# Patient Record
Sex: Female | Born: 1965 | Race: White | Hispanic: No | Marital: Married
Health system: Midwestern US, Academic
[De-identification: ages and names within clinical notes are randomized; demographics above are authoritative.]

---

## 2016-08-20 ENCOUNTER — Encounter: Admit: 2016-08-20 | Discharge: 2016-08-20

## 2016-08-20 ENCOUNTER — Ambulatory Visit: Admit: 2016-08-20 | Discharge: 2016-08-20 | Payer: Private Health Insurance - Indemnity

## 2016-08-20 ENCOUNTER — Ambulatory Visit: Admit: 2016-08-20 | Discharge: 2016-08-20

## 2016-08-20 DIAGNOSIS — N3000 Acute cystitis without hematuria: ICD-10-CM

## 2016-08-20 DIAGNOSIS — G35 Multiple sclerosis: ICD-10-CM

## 2016-08-20 DIAGNOSIS — G629 Polyneuropathy, unspecified: ICD-10-CM

## 2016-08-20 DIAGNOSIS — R51 Headache: ICD-10-CM

## 2016-08-20 DIAGNOSIS — Z79899 Other long term (current) drug therapy: ICD-10-CM

## 2016-08-20 DIAGNOSIS — I050 Rheumatic mitral stenosis: ICD-10-CM

## 2016-08-20 DIAGNOSIS — H547 Unspecified visual loss: ICD-10-CM

## 2016-08-20 DIAGNOSIS — N390 Urinary tract infection, site not specified: ICD-10-CM

## 2016-08-20 DIAGNOSIS — A64 Unspecified sexually transmitted disease: ICD-10-CM

## 2016-08-20 DIAGNOSIS — M1990 Unspecified osteoarthritis, unspecified site: Principal | ICD-10-CM

## 2016-08-20 MED ORDER — NITROFURANTOIN MACROCRYSTAL 50 MG PO CAP
50 mg | ORAL_CAPSULE | Freq: Two times a day (BID) | ORAL | 0 refills | 7.00000 days | Status: AC
Start: 2016-08-20 — End: ?

## 2016-08-20 MED ORDER — GADOBENATE DIMEGLUMINE 529 MG/ML (0.1MMOL/0.2ML) IV SOLN
11 mL | Freq: Once | INTRAVENOUS | 0 refills | Status: CP
Start: 2016-08-20 — End: ?
  Administered 2016-08-20: 18:00:00 11 mL via INTRAVENOUS

## 2016-08-21 NOTE — Assessment & Plan Note
Causing Pseudo-exacerbation  Macrodantin 50 mg QID

## 2016-08-22 LAB — COMPREHENSIVE METABOLIC PANEL
Lab: 0.4
Lab: 0.7
Lab: 103
Lab: 141 K/UL (ref 1.8–7.0)
Lab: 18
Lab: 29
Lab: 3.6
Lab: 3.8
Lab: 7.3
Lab: 9.2
Lab: 94

## 2016-08-22 LAB — CBC AND DIFF
Lab: 0.1
Lab: 0.1
Lab: 0.5
Lab: 0.9
Lab: 1.4 — ABNORMAL LOW
Lab: 12
Lab: 13
Lab: 2.4
Lab: 23 — ABNORMAL LOW
Lab: 255
Lab: 3.7
Lab: 30
Lab: 33
Lab: 39
Lab: 4.4
Lab: 5.8 — ABNORMAL HIGH
Lab: 63
Lab: 89
Lab: 9.2
Lab: 9.3

## 2016-08-27 ENCOUNTER — Encounter: Admit: 2016-08-27 | Discharge: 2016-08-27

## 2016-08-27 DIAGNOSIS — G35 Multiple sclerosis: ICD-10-CM

## 2016-08-27 DIAGNOSIS — R350 Frequency of micturition: ICD-10-CM

## 2016-08-27 DIAGNOSIS — R531 Weakness: Principal | ICD-10-CM

## 2016-08-28 ENCOUNTER — Encounter: Admit: 2016-08-28 | Discharge: 2016-08-28

## 2016-08-28 NOTE — Telephone Encounter
Pt calls asking for a disabled parking placard she is still having some balance issues and left leg weakness and was out and about and her spouse had to help her to there car when running errands. She was also asking about a scooter to help her at work and when she is out running errands and things for her left leg.     Dr. Burnadette Peter signed the disabled parking placard, stated insurance does not typically cover the scooter.     I called and advised patient that placard was placed in [place] mail run and to check med supply stores for pricing on scooters as well as with insurance and let us know if she needs anything from [place]. Pt understood.

## 2016-09-14 ENCOUNTER — Encounter: Admit: 2016-09-14 | Discharge: 2016-09-14

## 2016-09-14 DIAGNOSIS — G35 Multiple sclerosis: Principal | ICD-10-CM

## 2016-10-10 LAB — CBC AND DIFF
Lab: 0.1
Lab: 0.2
Lab: 0.6
Lab: 1.2
Lab: 1.3
Lab: 12
Lab: 13
Lab: 2.9
Lab: 24
Lab: 24
Lab: 243
Lab: 29
Lab: 3
Lab: 33
Lab: 39
Lab: 4.4
Lab: 5.2
Lab: 59
Lab: 87
Lab: 9.7

## 2016-10-10 LAB — COMPREHENSIVE METABOLIC PANEL
Lab: 102
Lab: 140
Lab: 3.6

## 2016-10-22 ENCOUNTER — Encounter: Admit: 2016-10-22 | Discharge: 2016-10-22

## 2016-10-22 DIAGNOSIS — G35 Multiple sclerosis: Principal | ICD-10-CM

## 2016-11-09 LAB — CBC AND DIFF
Lab: 0.1
Lab: 0.2
Lab: 0.6
Lab: 1.2
Lab: 1.2
Lab: 12
Lab: 12
Lab: 13
Lab: 2.8
Lab: 25
Lab: 250
Lab: 29
Lab: 3.9
Lab: 33
Lab: 4.6
Lab: 4.9
Lab: 40
Lab: 57
Lab: 88
Lab: 9.4

## 2016-11-09 LAB — COMPREHENSIVE METABOLIC PANEL
Lab: 103
Lab: 141
Lab: 3.4
Lab: 34

## 2016-11-22 ENCOUNTER — Encounter: Admit: 2016-11-22 | Discharge: 2016-11-22

## 2016-11-22 DIAGNOSIS — Z79899 Other long term (current) drug therapy: ICD-10-CM

## 2016-11-22 DIAGNOSIS — G35 Multiple sclerosis: Principal | ICD-10-CM

## 2016-11-26 ENCOUNTER — Encounter: Admit: 2016-11-26 | Discharge: 2016-11-26

## 2016-11-26 DIAGNOSIS — R531 Weakness: ICD-10-CM

## 2016-11-26 DIAGNOSIS — G35 Multiple sclerosis: ICD-10-CM

## 2016-11-26 DIAGNOSIS — M545 Low back pain: Principal | ICD-10-CM

## 2016-11-26 NOTE — Telephone Encounter
Per Dr. Burnadette Peter- ok to order UA and urine culture. Order placed and faxed to [facility] at [phone] [facility]: [phone]. Pt informed

## 2016-11-26 NOTE — Telephone Encounter
Pt c/o increased gait and balance issues for the last 1-2 weeks She was gardening today as it was cool out and she became very weak and could barely walk inside. Has noticed some mid lower back pain. These are the same symptoms she had 08-09-16 for a UTI.     Will discuss with Dr. Burnadette Peter and return her call

## 2016-11-27 ENCOUNTER — Encounter: Admit: 2016-11-27 | Discharge: 2016-11-27

## 2016-11-27 NOTE — Progress Notes
A prior authorization for Aubgio 14mg  was previously submitted to the patients prescription insurance [employer]. The prior authorization PA # [id number]  was approved from 11-26-16 through 11-07-17 .  Approval letter faxed to [facility] [phone]    [facility], LPN

## 2016-11-30 ENCOUNTER — Encounter: Admit: 2016-11-30 | Discharge: 2016-11-30

## 2016-11-30 DIAGNOSIS — R531 Weakness: ICD-10-CM

## 2016-11-30 DIAGNOSIS — G35 Multiple sclerosis: ICD-10-CM

## 2016-11-30 DIAGNOSIS — M545 Low back pain: Principal | ICD-10-CM

## 2016-12-03 LAB — COMPREHENSIVE METABOLIC PANEL
Lab: 140
Lab: 3.5

## 2016-12-03 LAB — CBC AND DIFF
Lab: 13
Lab: 29
Lab: 33
Lab: 4.5
Lab: 40
Lab: 6.1
Lab: 89

## 2016-12-07 ENCOUNTER — Encounter: Admit: 2016-12-07 | Discharge: 2016-12-07

## 2016-12-07 DIAGNOSIS — G35 Multiple sclerosis: Principal | ICD-10-CM

## 2016-12-07 DIAGNOSIS — Z79899 Other long term (current) drug therapy: ICD-10-CM

## 2017-01-03 ENCOUNTER — Encounter: Admit: 2017-01-03 | Discharge: 2017-01-03

## 2017-01-04 ENCOUNTER — Encounter: Admit: 2017-01-04 | Discharge: 2017-01-04

## 2017-01-04 DIAGNOSIS — E559 Vitamin D deficiency, unspecified: ICD-10-CM

## 2017-01-04 DIAGNOSIS — G35 Multiple sclerosis: Principal | ICD-10-CM

## 2017-01-07 ENCOUNTER — Encounter: Admit: 2017-01-07 | Discharge: 2017-01-07

## 2017-01-07 ENCOUNTER — Ambulatory Visit: Admit: 2017-01-07 | Discharge: 2017-01-08 | Payer: Commercial Managed Care - PPO

## 2017-01-07 DIAGNOSIS — N390 Urinary tract infection, site not specified: ICD-10-CM

## 2017-01-07 DIAGNOSIS — G35 Multiple sclerosis: ICD-10-CM

## 2017-01-07 DIAGNOSIS — A64 Unspecified sexually transmitted disease: ICD-10-CM

## 2017-01-07 DIAGNOSIS — H547 Unspecified visual loss: ICD-10-CM

## 2017-01-07 DIAGNOSIS — G629 Polyneuropathy, unspecified: ICD-10-CM

## 2017-01-07 DIAGNOSIS — M1990 Unspecified osteoarthritis, unspecified site: Principal | ICD-10-CM

## 2017-01-07 DIAGNOSIS — R51 Headache: ICD-10-CM

## 2017-01-07 DIAGNOSIS — I050 Rheumatic mitral stenosis: ICD-10-CM

## 2017-01-07 NOTE — Progress Notes
aDate of Service: 01/07/2017    Subjective:             Monica Burnett is a 51 y.o. female.    History of Present Illness  She is noticing some jumping of her thumb of her left hand when she uses her hand, particularly when she is holding on to something. This has been going on for perhaps a month. It is staying about the same. She is noticing some slight difficulty putting her left foot in slippers. No prior similar symptoms. She continues to take Aubagio faithfully without issue. She takes vitamin D and exercises regularly.       Review of Systems   Constitutional: Positive for activity change and diaphoresis.   HENT: Positive for sinus pressure, tinnitus and trouble swallowing.    Respiratory: Positive for cough.    Gastrointestinal: Positive for abdominal distention and constipation.   Endocrine: Positive for cold intolerance, heat intolerance and polydipsia.   Genitourinary: Positive for decreased urine volume, difficulty urinating, flank pain and urgency.   Musculoskeletal: Positive for arthralgias, neck pain and neck stiffness.   Allergic/Immunologic: Positive for environmental allergies.   Neurological: Positive for tremors and weakness.   Hematological: Bruises/bleeds easily.   Psychiatric/Behavioral: Positive for sleep disturbance.   All other systems reviewed and are negative.        Objective:         ??? AUBAGIO 14 mg tab Take 1 tablet by mouth once per day. Take at the same time everyday.   ??? cholecalciferol (VITAMIN D-3) 1,000 units tablet Take 1,000 Units by mouth daily.   ??? cyanocobalamin(+) (VITAMIN B-12) 500 mcg tablet Take 1,000 mcg by mouth daily.   ??? MAGNESIUM HYDROXIDE (MAGNESIA PO) Take 1 Tab by mouth daily.   ??? MULTIVITAMIN PO Take 6 Doses by mouth. Takes 6 smarty pants womens gummies daily     Vitals:    01/07/17 1304   BP: 117/80   Pulse: 71   Resp: 16   Weight: 58 kg (127 lb 12.8 oz)   Height: 162.6 cm (64)     Body mass index is 21.94 kg/m???.     Physical Exam Depression Screening was performed on Bradleigh Sonnen in clinic today. Based on the score of 4, no follow up action or recommendations are necessary at this time.  Examination    Mental Status Exam: Patient is alert and oriented in all four spheres. There is normal short and long term memory. Language functions are normal both for comprehension and expression.  Speech and Language: normal  HEENT: Normal.  Cranial Nerve Exam:   Cranial Nerve II Right Left   Visual Acuity 20/15 -1 cc 20/20 -1 cc   Pupil x x   Visual Fields x x   Fundoscopic x x   Color Vision       Cranial Nerves III-XII Right Left   III, IV, VI (EOM's) x x   V x x   VII x x   VIII x x   IX, X x x   XI x x   XII x x     Nystagmus: None    Motor:    R L   R L   Neck flexors    Hip flexors 5 4   Neck extensors    Hip abductors     Shoulder Abductors 5 5  Hip extensors 5 5   Elbow flexors 5 5  Hip adductors     Elbow extensors  5 5  Knee flexors 5 5-   Wrist flexors 5 5  Knee extensors 5 5   Wrist extensors 5 5  Ankle dorsiflexors 5 5   Finger flexors 5 5  Ankle plantar flexors 5 5   Finger abductors 5 5  Ankle inversion      5 5  Ankle eversion         Toe flexors         Toe extensors       Bulk and Tone:    Upper Extremity R L   Atrophy No No   Increased Tone No No   Fasciculations No No     Lower Extremity R L   Atrophy No No   Increased Tone No No   Fasciculations No No     Sensory Examination Light Touch:  R L   Upper Extremity x x   Lower Extremity x x       Cerebellar/Fine Motor R L   Finger nose finger Normal Normal   Heel to shin     Finger tapping     Foot tapping     Rapid alternating movements Normal Normal     Gait: Wide based, mild ataxic gait. Some moderate difficulty with heel and toe walk. Unable to tandem.  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 Interval slight increase in prominence of a dominant left periventricular lesion which may be related to technique or enlarging lesion.  2. ???No new or enhancing white matter lesion.    LP positive oligoclonal bands, IgG index 1.69, IgG synthesis 11.2            Multiple sclerosis (HCC)  Clinically stable MS. Left hand symptoms could be manifestation of fatigue. Do not think this represents an exacerbation or reason to change treatment.  Continue Aubagio.  Labs today.  Continue exercise and vitamin D.    RTC in 6 months.  Demetrio Lapping, DO  MS Fellow        ATTESTATION  I personally performed the key portions of the E/M visit, discussed the case with the Fellow, and concur with the fellow's evaluation and plan stated above. I have altered the note to better reflect my evaluation and plan.    Lynnell Jude MD  Director of [facility]        ATTESTATION  I personally performed the key portions of the E/M visit, discussed the case with the Fellow, and concur with the fellow's evaluation and plan stated above. I have altered the note to better reflect my evaluation and plan.    Lynnell Jude MD  Director of [facility] for [facility]

## 2017-01-07 NOTE — Progress Notes
Neurology Clinic ??? Clinical Pharmacist Note     Physician: Dr. Burnadette Peter    Reason for visit: Monica Burnett is a 51 y.o. female who presents to clinic for 3 month follow-up visit with Dr. Burnadette Peter. Patient presents to clinic with her mother.    HPI: At last visit on 08/20/2016.Monica Burnett is complaining of a tremor in her left hand and left foot.     MS Subtype: RRMS  Last Relapse: ~2015  Symptoms: tremor left foot and left hand (01/07/2017) Patient to discuss treatment of symptom management with Dr. Burnadette Peter.   Date of Diagnosis: ~2013  Prior Medication Failures: Copaxone and Tecfidera  Current Medication: Aubagio 14 mg by mouth daily     Last MRI 08/2016 per radiologists impression  Redemonstration of multiple FLAIR hyperintense white matter lesions compatible with reported multiple sclerosis. Interval slight increase in prominence of a dominant left periventricular lesion which may be related to technique or enlarging lesion.  2. ???No new or enhancing white matter lesion.    Labs  LP positive oligoclonal bands, IgG index 1.69, IgG synthesis 11.2    CBC:   Lab Results   Component Value Date/Time    HGB 13.4 12/03/2016    HCT 40.5 12/03/2016    PLTCT 255 12/03/2016    WBC 6.1 12/03/2016    NEUT 63.3 12/03/2016    ANC 3.9 12/03/2016    LYMA 21.7 12/03/2016    ALC 1.3 12/03/2016    MONA 9.5 12/03/2016    AMC 0.6 12/03/2016    EOSA 1.7 03/09/2016 02:57 PM    AEC 0.3 12/03/2016    BASA 1.1 12/03/2016    ABC 0.1 12/03/2016    RBC 4.52 12/03/2016    MCV 89.6 12/03/2016    MCH 29.6 12/03/2016    MCHC 33.1 12/03/2016    MPV 9.4 12/03/2016    RDW 13.1 12/03/2016       CMP:   Lab Results   Component Value Date    NA 140 12/03/2016    K 3.5 12/03/2016    CL 102 12/03/2016    CO2 31.5 12/03/2016    GAP 4 10/29/2014    GLU 96 12/03/2016    ALBUMIN 3.6 12/03/2016    GLOBULIN 2.1 03/09/2016    CA 9.4 12/03/2016    TOTBILI 0.3 12/03/2016       Enzymes:  Lab Results   Component Value Date    AST 24 12/03/2016    ALT 50 12/03/2016 ALKPHOS 150 12/03/2016       Renal Function:  Lab Results   Component Value Date/Time    BUN 19.0 12/03/2016    CR 0.7 12/03/2016    GFR 95 12/03/2016    GFRAA 115 03/09/2016 02:57 PM     Calculated CrCl: ~59 ml/min (calculated from 12/03/16 creatinine lab and ideal body weight)    Vitamin D:   Lab Results   Component Value Date/Time    VITD25 46 03/09/2016 02:57 PM       Height and weight:   Wt Readings from Last 1 Encounters:   01/07/17 58 kg (127 lb 12.8 oz)     Ht Readings from Last 1 Encounters:   01/07/17 162.6 cm (64)       Medications  Medications were reviewed with the patient/caregiver and were updated as per below:  Current Outpatient Prescriptions on File Prior to Visit   Medication Sig Dispense Refill   ??? AUBAGIO 14 mg tab Take 1 tablet by mouth  once per day. Take at the same time everyday. 28 tablet 11   ??? cholecalciferol (VITAMIN D-3) 1,000 units tablet Take 1,000 Units by mouth daily.     ??? cyanocobalamin(+) (VITAMIN B-12) 500 mcg tablet Take 1,000 mcg by mouth daily.     ??? MAGNESIUM HYDROXIDE (MAGNESIA PO) Take 1 Tab by mouth daily.     ??? MULTIVITAMIN PO Take 6 Doses by mouth. Takes 6 smarty pants womens gummies daily       No current facility-administered medications on file prior to visit.          Assessment/Plan ??? This assessment and plan was discussed with and agreed upon by Dr. Burnadette Peter.    1. Multiple Sclerosis- Controlled.  Patient is not experiencing any side effects and is toleration medication well.              - Patient currently on Aubagio 14 mg by mouth daily. Patient reports no adverse effects and no missed doses in the past month.               - Monitoring: CBC with diff and CMP WNL 12/03/2016. Labs next due ~02/2017. Continue to monitor side effects and compliance, no current issues.     2. Vitamin D- Vitamin D level of 47 on 03/09/2016. Goal: ~30-60 ng/mL: Level is within goal range               - Continue: vitamin D 1,000 IU by mouth daily - Monitoring: yearly vitamin D, next due ~02/2017    3. Labs    - Jan has currently been getting CBC with diff and CMP ~ monthly since started Aubagio in 02/2016. CBC with diff and CMP due monthly for the first 6 months then every 3 months thereafter. All labs have been WNL.    - Informed patient to clarify with Dr. Burnadette Peter how frequent she would like labs.     4. Medication access and adherence: Patient demonstrated knowledge of medications and reports medication adherence. No access issues were identified.   - Emphasis was placed on the importance of medication adherence     Instructed patient to contact clinical pharmacist at [phone] with any questions or concerns about their medications.    Follow-up at clinic appointment in approximately 3-6 months.    Turner Daniels, [facility]   PGY2 Ambulatory Care Pharmacy Practice Resident

## 2017-01-07 NOTE — Assessment & Plan Note
Clinically stable MS. Left hand symptoms could be manifestation of fatigue. Do not think this represents an exacerbation or reason to change treatment.  Continue Aubagio.  Labs today.  Continue exercise and vitamin D.

## 2017-01-08 DIAGNOSIS — Z79899 Other long term (current) drug therapy: ICD-10-CM

## 2017-01-08 DIAGNOSIS — G35 Multiple sclerosis: Principal | ICD-10-CM

## 2017-01-08 LAB — CBC AND DIFF: Lab: 4.9 10*3/uL (ref 3.8–10.8)

## 2017-01-08 LAB — COMPREHENSIVE METABOLIC PANEL: Lab: 83 mg/dL — ABNORMAL HIGH (ref >=60)

## 2017-01-14 ENCOUNTER — Encounter: Admit: 2017-01-14 | Discharge: 2017-01-14

## 2017-02-20 ENCOUNTER — Encounter: Admit: 2017-02-20 | Discharge: 2017-02-20

## 2017-02-20 DIAGNOSIS — R350 Frequency of micturition: ICD-10-CM

## 2017-02-20 DIAGNOSIS — R300 Dysuria: Principal | ICD-10-CM

## 2017-02-20 DIAGNOSIS — R39198 Other difficulties with micturition: ICD-10-CM

## 2017-02-20 NOTE — Telephone Encounter
I spoke with the patient who stated that she's been having increased dizziness and unsteady gait for the past 2 weeks.  She stated that these are not new symptoms, but they have worsened and she is now having to use a cane because otherwise she is tipping over and can't walk straight, almost like she is drunk.  She hasn't had any recent changes in medications, been sick, or felt any increased stress.

## 2017-02-21 NOTE — Telephone Encounter
Patient was advised that Dr. Burnadette Peter wants her to get a UA to make sure she doesn't have a UTI, which is causing her exacerbated symptoms.  The patient stated that she has had some urinary frequency and urgency lately.  She will go to the lab at [facility] to give a urine sample today. Orders were faxed to [facility] at [phone].  Patient verbalized understanding that we will call once we get the results, but that she should contact the office if her symptoms worsen before she hears from [place].  She stated that if her symptoms were to get any worse that she would probably go to the ER.    Patient called back and wanted her orders faxed to the [facility] at [phone]. Orders faxed.

## 2017-02-26 ENCOUNTER — Encounter: Admit: 2017-02-26 | Discharge: 2017-02-26

## 2017-02-26 MED ORDER — PREDNISONE 20 MG PO TAB
ORAL_TABLET | 0 refills | Status: AC
Start: 2017-02-26 — End: 2017-04-01

## 2017-02-26 NOTE — Telephone Encounter
Dr. Burnadette Peter reviewed UA results, which was normal. Per Dr. Burnadette Peter: send in regular steroid taper: Prednisone 20mg : 4 tabs(80mg )by mouth x 10 days,then 3 tabs(60mg )by mouth x3 days,then 2 tabs(40mg )by mouth x 3 days,then1 tab(20mg )by mouth x 3 days #58. Have her call us in a few days if she is not doing better. Read back and verified.   Called and informed patient of plan, educated her on steroid taper and instructions. She will call us Friday with a update on her symptoms. Rx sent to her pharmacy.

## 2017-02-26 NOTE — Telephone Encounter
Pt calls with update on symptoms from last week. Reports that she is still having unsteady gait that has worsened over the past few days. Difficulty walking straight, Left foot is slightly numb now. Over this past weekend she began having vision issues where vision looks cross eyed and trouble focusing. Her spouse tells her that her eyes look normal. Complains of being disoriented, and generally off balance.     Will discuss with Dr. Burnadette Peter and return her call

## 2017-03-26 ENCOUNTER — Encounter: Admit: 2017-03-26 | Discharge: 2017-03-26

## 2017-03-26 MED ORDER — AUBAGIO 14 MG PO TAB
ORAL_TABLET | Freq: Every day | 5 refills | Status: AC
Start: 2017-03-26 — End: 2017-04-01

## 2017-03-29 ENCOUNTER — Encounter: Admit: 2017-03-29 | Discharge: 2017-03-29

## 2017-04-01 ENCOUNTER — Ambulatory Visit: Admit: 2017-04-01 | Discharge: 2017-04-02 | Payer: Commercial Managed Care - PPO

## 2017-04-01 ENCOUNTER — Encounter: Admit: 2017-04-01 | Discharge: 2017-04-01

## 2017-04-01 DIAGNOSIS — H547 Unspecified visual loss: ICD-10-CM

## 2017-04-01 DIAGNOSIS — R51 Headache: ICD-10-CM

## 2017-04-01 DIAGNOSIS — M1990 Unspecified osteoarthritis, unspecified site: Principal | ICD-10-CM

## 2017-04-01 DIAGNOSIS — A64 Unspecified sexually transmitted disease: ICD-10-CM

## 2017-04-01 DIAGNOSIS — I050 Rheumatic mitral stenosis: ICD-10-CM

## 2017-04-01 DIAGNOSIS — G35 Multiple sclerosis: ICD-10-CM

## 2017-04-01 DIAGNOSIS — N390 Urinary tract infection, site not specified: ICD-10-CM

## 2017-04-01 DIAGNOSIS — G629 Polyneuropathy, unspecified: ICD-10-CM

## 2017-04-01 MED ORDER — TERIFLUNOMIDE 14 MG PO TAB
14 mg | ORAL_TABLET | Freq: Every day | ORAL | 0 refills | Status: AC
Start: 2017-04-01 — End: 2017-04-01

## 2017-04-01 MED ORDER — PREDNISONE 20 MG PO TAB
ORAL_TABLET | 0 refills | Status: AC
Start: 2017-04-01 — End: 2017-11-23

## 2017-04-02 DIAGNOSIS — G35 Multiple sclerosis: Principal | ICD-10-CM

## 2017-04-03 ENCOUNTER — Encounter: Admit: 2017-04-03 | Discharge: 2017-04-03

## 2017-04-04 ENCOUNTER — Encounter: Admit: 2017-04-04 | Discharge: 2017-04-04

## 2017-04-09 ENCOUNTER — Encounter: Admit: 2017-04-09 | Discharge: 2017-04-09

## 2017-04-09 DIAGNOSIS — G35 Multiple sclerosis: ICD-10-CM

## 2017-04-09 DIAGNOSIS — Z79899 Other long term (current) drug therapy: Principal | ICD-10-CM

## 2017-04-09 MED ORDER — DIMETHYL FUMARATE 120 MG (14)- 240 MG (46) PO CPDR
1 | ORAL_CAPSULE | Freq: Two times a day (BID) | ORAL | 0 refills | 30.00000 days | Status: AC
Start: 2017-04-09 — End: 2017-04-09

## 2017-04-09 MED ORDER — DIMETHYL FUMARATE 120 MG (14)- 240 MG (46) PO CPDR
1 | ORAL_CAPSULE | Freq: Two times a day (BID) | ORAL | 0 refills | 30.00000 days | Status: AC
Start: 2017-04-09 — End: 2017-05-10

## 2017-04-12 ENCOUNTER — Encounter: Admit: 2017-04-12 | Discharge: 2017-04-12

## 2017-04-17 ENCOUNTER — Encounter: Admit: 2017-04-17 | Discharge: 2017-04-17

## 2017-04-18 ENCOUNTER — Encounter: Admit: 2017-04-18 | Discharge: 2017-04-18

## 2017-04-18 DIAGNOSIS — G35 Multiple sclerosis: Principal | ICD-10-CM

## 2017-04-19 ENCOUNTER — Encounter: Admit: 2017-04-19 | Discharge: 2017-04-19

## 2017-04-22 ENCOUNTER — Encounter: Admit: 2017-04-22 | Discharge: 2017-04-22

## 2017-04-26 ENCOUNTER — Ambulatory Visit: Admit: 2017-04-26 | Discharge: 2017-04-26 | Payer: Commercial Managed Care - PPO

## 2017-04-26 ENCOUNTER — Ambulatory Visit: Admit: 2017-04-26 | Discharge: 2017-04-26

## 2017-04-26 ENCOUNTER — Encounter: Admit: 2017-04-26 | Discharge: 2017-04-26

## 2017-04-26 DIAGNOSIS — I050 Rheumatic mitral stenosis: ICD-10-CM

## 2017-04-26 DIAGNOSIS — H547 Unspecified visual loss: ICD-10-CM

## 2017-04-26 DIAGNOSIS — G35 Multiple sclerosis: Principal | ICD-10-CM

## 2017-04-26 DIAGNOSIS — N390 Urinary tract infection, site not specified: ICD-10-CM

## 2017-04-26 DIAGNOSIS — G629 Polyneuropathy, unspecified: ICD-10-CM

## 2017-04-26 DIAGNOSIS — A64 Unspecified sexually transmitted disease: ICD-10-CM

## 2017-04-26 DIAGNOSIS — M1990 Unspecified osteoarthritis, unspecified site: Principal | ICD-10-CM

## 2017-04-26 DIAGNOSIS — R51 Headache: ICD-10-CM

## 2017-04-26 MED ORDER — GADOBENATE DIMEGLUMINE 529 MG/ML (0.1MMOL/0.2ML) IV SOLN
12 mL | Freq: Once | INTRAVENOUS | 0 refills | Status: CP
Start: 2017-04-26 — End: ?
  Administered 2017-04-26: 17:00:00 12 mL via INTRAVENOUS

## 2017-05-02 ENCOUNTER — Encounter: Admit: 2017-05-02 | Discharge: 2017-05-02

## 2017-05-09 ENCOUNTER — Encounter: Admit: 2017-05-09 | Discharge: 2017-05-09

## 2017-05-10 ENCOUNTER — Encounter: Admit: 2017-05-10 | Discharge: 2017-05-10

## 2017-05-10 MED ORDER — DIMETHYL FUMARATE 240 MG PO CPDR
240 mg | ORAL_CAPSULE | Freq: Two times a day (BID) | ORAL | 3 refills | 30.00000 days | Status: AC
Start: 2017-05-10 — End: 2017-07-29

## 2017-05-13 ENCOUNTER — Encounter: Admit: 2017-05-13 | Discharge: 2017-05-13

## 2017-06-11 ENCOUNTER — Encounter: Admit: 2017-06-11 | Discharge: 2017-06-11

## 2017-06-11 LAB — CBC AND DIFF: Lab: 6.2 mL/min (ref >60)

## 2017-06-11 LAB — COMPREHENSIVE METABOLIC PANEL
Lab: 102 mL/min (ref >60)
Lab: 138 U/L — ABNORMAL HIGH (ref 7–56)
Lab: 3.8 K/UL (ref 3–12)

## 2017-06-19 ENCOUNTER — Encounter: Admit: 2017-06-19 | Discharge: 2017-06-19

## 2017-06-25 ENCOUNTER — Encounter: Admit: 2017-06-25 | Discharge: 2017-06-25

## 2017-06-25 DIAGNOSIS — G35 Multiple sclerosis: Principal | ICD-10-CM

## 2017-06-25 DIAGNOSIS — Z79899 Other long term (current) drug therapy: ICD-10-CM

## 2017-06-25 MED ORDER — OCRELIZUMAB 600 MG IVPB
600 mg | Freq: Once | INTRAVENOUS | 0 refills | Status: CN
Start: 2017-06-25 — End: ?

## 2017-06-25 MED ORDER — ACETAMINOPHEN 500 MG PO TAB
500 mg | Freq: Once | ORAL | 0 refills | Status: CN
Start: 2017-06-25 — End: ?

## 2017-06-25 MED ORDER — METHYLPREDNISOLONE SOD SUC(PF) 125 MG/2 ML IJ SOLR
100 mg | Freq: Once | INTRAVENOUS | 0 refills | Status: CN
Start: 2017-06-25 — End: ?

## 2017-06-25 MED ORDER — DIPHENHYDRAMINE HCL 25 MG PO CAP
25 mg | Freq: Once | ORAL | 0 refills | Status: CN
Start: 2017-06-25 — End: ?

## 2017-06-25 MED ORDER — OCRELIZUMAB 300 MG IVPB
300 mg | Freq: Once | INTRAVENOUS | 0 refills | Status: CN
Start: 2017-06-25 — End: ?

## 2017-06-27 LAB — COMPREHENSIVE METABOLIC PANEL
Lab: 0.7
Lab: 103
Lab: 106
Lab: 142
Lab: 15
Lab: 3.5
Lab: 30
Lab: 9.5

## 2017-06-27 LAB — CBC AND DIFF
Lab: 0.3
Lab: 1.3
Lab: 12
Lab: 13
Lab: 2.5
Lab: 262
Lab: 27
Lab: 30
Lab: 33
Lab: 38
Lab: 4.2
Lab: 4.5
Lab: 55
Lab: 7.3
Lab: 8.2
Lab: 8.9
Lab: 90

## 2017-06-28 LAB — HEPATITIS B SURFACE AG: Lab: NEGATIVE MMOL/L (ref 3.5–5.1)

## 2017-06-28 LAB — HEPATITIS B SURFACE AB

## 2017-06-28 LAB — HEPATITIS B CORE AB TOT (IGG+IGM): Lab: NEGATIVE

## 2017-07-10 ENCOUNTER — Encounter: Admit: 2017-07-10 | Discharge: 2017-07-10

## 2017-07-10 DIAGNOSIS — G35 Multiple sclerosis: ICD-10-CM

## 2017-07-10 DIAGNOSIS — Z79899 Other long term (current) drug therapy: Principal | ICD-10-CM

## 2017-07-12 ENCOUNTER — Encounter: Admit: 2017-07-12 | Discharge: 2017-07-12

## 2017-07-12 DIAGNOSIS — Z79899 Other long term (current) drug therapy: Principal | ICD-10-CM

## 2017-07-17 ENCOUNTER — Encounter: Admit: 2017-07-17 | Discharge: 2017-07-17

## 2017-07-17 DIAGNOSIS — Z79899 Other long term (current) drug therapy: Principal | ICD-10-CM

## 2017-07-17 DIAGNOSIS — G35 Multiple sclerosis: ICD-10-CM

## 2017-07-23 ENCOUNTER — Encounter: Admit: 2017-07-23 | Discharge: 2017-07-23

## 2017-07-24 ENCOUNTER — Encounter: Admit: 2017-07-24 | Discharge: 2017-07-24

## 2017-07-24 DIAGNOSIS — G35 Multiple sclerosis: Principal | ICD-10-CM

## 2017-07-24 DIAGNOSIS — Z79899 Other long term (current) drug therapy: ICD-10-CM

## 2017-07-25 ENCOUNTER — Encounter: Admit: 2017-07-25 | Discharge: 2017-07-25

## 2017-07-29 ENCOUNTER — Ambulatory Visit: Admit: 2017-07-29 | Discharge: 2017-07-30 | Payer: Commercial Managed Care - PPO

## 2017-07-29 ENCOUNTER — Encounter: Admit: 2017-07-29 | Discharge: 2017-07-29

## 2017-07-29 DIAGNOSIS — G35 Multiple sclerosis: ICD-10-CM

## 2017-07-29 DIAGNOSIS — M1990 Unspecified osteoarthritis, unspecified site: Principal | ICD-10-CM

## 2017-07-29 DIAGNOSIS — I050 Rheumatic mitral stenosis: ICD-10-CM

## 2017-07-29 DIAGNOSIS — R51 Headache: ICD-10-CM

## 2017-07-29 DIAGNOSIS — N390 Urinary tract infection, site not specified: ICD-10-CM

## 2017-07-29 DIAGNOSIS — A64 Unspecified sexually transmitted disease: ICD-10-CM

## 2017-07-29 DIAGNOSIS — G629 Polyneuropathy, unspecified: ICD-10-CM

## 2017-07-29 DIAGNOSIS — H547 Unspecified visual loss: ICD-10-CM

## 2017-07-29 MED ORDER — ACETAMINOPHEN 500 MG PO TAB
500 mg | Freq: Once | ORAL | 0 refills | Status: CP
Start: 2017-07-29 — End: ?
  Administered 2017-07-29: 15:00:00 500 mg via ORAL

## 2017-07-29 MED ORDER — OCRELIZUMAB 300 MG IVPB
300 mg | Freq: Once | INTRAVENOUS | 0 refills | Status: CP
Start: 2017-07-29 — End: ?
  Administered 2017-07-29: 16:00:00 300 mg via INTRAVENOUS

## 2017-07-29 MED ORDER — OCRELIZUMAB 600 MG IVPB
600 mg | Freq: Once | INTRAVENOUS | 0 refills | Status: CN
Start: 2017-07-29 — End: ?

## 2017-07-29 MED ORDER — DIPHENHYDRAMINE HCL 25 MG PO CAP
25 mg | Freq: Once | ORAL | 0 refills | Status: CN
Start: 2017-07-29 — End: ?

## 2017-07-29 MED ORDER — METHYLPREDNISOLONE SOD SUC(PF) 125 MG/2 ML IJ SOLR
100 mg | Freq: Once | INTRAVENOUS | 0 refills | Status: CN
Start: 2017-07-29 — End: ?

## 2017-07-29 MED ORDER — ACETAMINOPHEN 500 MG PO TAB
500 mg | Freq: Once | ORAL | 0 refills | Status: CN
Start: 2017-07-29 — End: ?

## 2017-07-29 MED ORDER — METHYLPREDNISOLONE SOD SUC(PF) 125 MG/2 ML IJ SOLR
100 mg | Freq: Once | INTRAVENOUS | 0 refills | Status: CP
Start: 2017-07-29 — End: ?
  Administered 2017-07-29: 15:00:00 100 mg via INTRAVENOUS

## 2017-07-29 MED ORDER — OCRELIZUMAB 300 MG IVPB
300 mg | Freq: Once | INTRAVENOUS | 0 refills | Status: CN
Start: 2017-07-29 — End: ?

## 2017-07-29 MED ORDER — DIPHENHYDRAMINE HCL 25 MG PO CAP
25 mg | Freq: Once | ORAL | 0 refills | Status: CP
Start: 2017-07-29 — End: ?
  Administered 2017-07-29: 15:00:00 25 mg via ORAL

## 2017-07-30 DIAGNOSIS — G35 Multiple sclerosis: Principal | ICD-10-CM

## 2017-08-01 ENCOUNTER — Encounter: Admit: 2017-08-01 | Discharge: 2017-08-01

## 2017-08-01 MED ORDER — GABAPENTIN 300 MG PO CAP
300 mg | ORAL_CAPSULE | Freq: Three times a day (TID) | ORAL | 5 refills | Status: AC
Start: 2017-08-01 — End: 2017-10-28

## 2017-08-08 ENCOUNTER — Encounter: Admit: 2017-08-08 | Discharge: 2017-08-08

## 2017-08-15 ENCOUNTER — Encounter: Admit: 2017-08-15 | Discharge: 2017-08-15

## 2017-08-15 ENCOUNTER — Ambulatory Visit: Admit: 2017-08-15 | Discharge: 2017-08-16 | Payer: Commercial Managed Care - PPO

## 2017-08-15 DIAGNOSIS — A64 Unspecified sexually transmitted disease: ICD-10-CM

## 2017-08-15 DIAGNOSIS — H547 Unspecified visual loss: ICD-10-CM

## 2017-08-15 DIAGNOSIS — G35 Multiple sclerosis: Principal | ICD-10-CM

## 2017-08-15 DIAGNOSIS — M1990 Unspecified osteoarthritis, unspecified site: Principal | ICD-10-CM

## 2017-08-15 DIAGNOSIS — N390 Urinary tract infection, site not specified: ICD-10-CM

## 2017-08-15 DIAGNOSIS — I050 Rheumatic mitral stenosis: ICD-10-CM

## 2017-08-15 DIAGNOSIS — R51 Headache: ICD-10-CM

## 2017-08-15 DIAGNOSIS — G629 Polyneuropathy, unspecified: ICD-10-CM

## 2017-08-15 MED ORDER — METHYLPREDNISOLONE SOD SUC(PF) 125 MG/2 ML IJ SOLR
100 mg | Freq: Once | INTRAVENOUS | 0 refills | Status: CN
Start: 2017-08-15 — End: ?

## 2017-08-15 MED ORDER — OCRELIZUMAB 300 MG IVPB
300 mg | Freq: Once | INTRAVENOUS | 0 refills | Status: CN
Start: 2017-08-15 — End: ?

## 2017-08-15 MED ORDER — DIPHENHYDRAMINE HCL 25 MG PO CAP
25 mg | Freq: Once | ORAL | 0 refills | Status: CN
Start: 2017-08-15 — End: ?

## 2017-08-15 MED ORDER — OCRELIZUMAB 600 MG IVPB
600 mg | Freq: Once | INTRAVENOUS | 0 refills | Status: CN
Start: 2017-08-15 — End: ?

## 2017-08-15 MED ORDER — METHYLPREDNISOLONE SOD SUC(PF) 125 MG/2 ML IJ SOLR
100 mg | Freq: Once | INTRAVENOUS | 0 refills | Status: CP
Start: 2017-08-15 — End: ?
  Administered 2017-08-15: 15:00:00 100 mg via INTRAVENOUS

## 2017-08-15 MED ORDER — ACETAMINOPHEN 500 MG PO TAB
500 mg | Freq: Once | ORAL | 0 refills | Status: CP
Start: 2017-08-15 — End: ?
  Administered 2017-08-15: 15:00:00 500 mg via ORAL

## 2017-08-15 MED ORDER — ACETAMINOPHEN 500 MG PO TAB
500 mg | Freq: Once | ORAL | 0 refills | Status: CN
Start: 2017-08-15 — End: ?

## 2017-08-15 MED ORDER — OCRELIZUMAB 300 MG IVPB
300 mg | Freq: Once | INTRAVENOUS | 0 refills | Status: CP
Start: 2017-08-15 — End: ?
  Administered 2017-08-15 (×2): 300 mg via INTRAVENOUS

## 2017-08-15 MED ORDER — DIPHENHYDRAMINE HCL 25 MG PO CAP
25 mg | Freq: Once | ORAL | 0 refills | Status: CP
Start: 2017-08-15 — End: ?
  Administered 2017-08-15: 15:00:00 25 mg via ORAL

## 2017-08-19 ENCOUNTER — Encounter: Admit: 2017-08-19 | Discharge: 2017-08-19

## 2017-10-11 LAB — COMPREHENSIVE METABOLIC PANEL
Lab: 0.6
Lab: 0.7
Lab: 103 10*3/uL (ref 0–0.20)
Lab: 11
Lab: 118
Lab: 140 mL/min (ref >60)
Lab: 17
Lab: 21
Lab: 29
Lab: 3.8 10*3/uL (ref 0–0.45)
Lab: 4.2
Lab: 8
Lab: 9
Lab: 93
Lab: 94

## 2017-10-11 LAB — CBC AND DIFF
Lab: 0.1
Lab: 0.1
Lab: 0.3
Lab: 0.3
Lab: 0.5
Lab: 1
Lab: 1
Lab: 1.1
Lab: 1.2
Lab: 12
Lab: 14 U/L — ABNORMAL LOW (ref 25–110)
Lab: 14 — AB
Lab: 20
Lab: 20
Lab: 297
Lab: 3.7
Lab: 3.9
Lab: 30
Lab: 34 10*3/uL (ref 3–12)
Lab: 35
Lab: 4.3
Lab: 4.8
Lab: 4.9 g/dL (ref 3.5–5.0)
Lab: 41
Lab: 43 U/L — ABNORMAL HIGH (ref 7–40)
Lab: 5
Lab: 5.6 mg/dL (ref 0.3–1.2)
Lab: 5.9
Lab: 66
Lab: 66 10*3/uL (ref 0–0.20)
Lab: 8
Lab: 8
Lab: 85
Lab: 87 MMOL/L (ref 21–30)
Lab: 9.3

## 2017-10-25 ENCOUNTER — Encounter: Admit: 2017-10-25 | Discharge: 2017-10-25

## 2017-10-25 DIAGNOSIS — G35 Multiple sclerosis: Secondary | ICD-10-CM

## 2017-10-25 DIAGNOSIS — Z79899 Other long term (current) drug therapy: Principal | ICD-10-CM

## 2017-10-28 ENCOUNTER — Encounter: Admit: 2017-10-28 | Discharge: 2017-10-28

## 2017-10-28 DIAGNOSIS — G35 Multiple sclerosis: Principal | ICD-10-CM

## 2017-10-28 DIAGNOSIS — R269 Unspecified abnormalities of gait and mobility: ICD-10-CM

## 2017-10-28 DIAGNOSIS — R300 Dysuria: ICD-10-CM

## 2017-10-28 LAB — URINALYSIS, MICROSCOPIC
Lab: 7
Lab: NEGATIVE
Lab: NEGATIVE
Lab: NEGATIVE
Lab: NEGATIVE

## 2017-10-29 ENCOUNTER — Encounter: Admit: 2017-10-29 | Discharge: 2017-10-29

## 2017-10-29 DIAGNOSIS — R300 Dysuria: ICD-10-CM

## 2017-10-29 DIAGNOSIS — R269 Unspecified abnormalities of gait and mobility: ICD-10-CM

## 2017-10-29 DIAGNOSIS — G35 Multiple sclerosis: Principal | ICD-10-CM

## 2017-10-29 MED ORDER — SULFAMETHOXAZOLE-TRIMETHOPRIM 800-160 MG PO TAB
1 | ORAL_TABLET | Freq: Two times a day (BID) | ORAL | 0 refills | Status: AC
Start: 2017-10-29 — End: ?

## 2017-11-01 ENCOUNTER — Encounter: Admit: 2017-11-01 | Discharge: 2017-11-01

## 2017-11-05 ENCOUNTER — Encounter: Admit: 2017-11-05 | Discharge: 2017-11-05

## 2017-11-05 DIAGNOSIS — Z79899 Other long term (current) drug therapy: Principal | ICD-10-CM

## 2017-11-05 DIAGNOSIS — G35 Multiple sclerosis: ICD-10-CM

## 2017-11-07 ENCOUNTER — Encounter: Admit: 2017-11-07 | Discharge: 2017-11-07

## 2017-11-07 DIAGNOSIS — R300 Dysuria: ICD-10-CM

## 2017-11-07 DIAGNOSIS — G35 Multiple sclerosis: Principal | ICD-10-CM

## 2017-11-07 DIAGNOSIS — R269 Unspecified abnormalities of gait and mobility: ICD-10-CM

## 2017-11-19 LAB — CBC AND DIFF
Lab: 0.1
Lab: 0.2
Lab: 0.4
Lab: 1.2
Lab: 1.2
Lab: 12
Lab: 13
Lab: 2.4
Lab: 28
Lab: 30
Lab: 318 — ABNORMAL HIGH
Lab: 34
Lab: 38
Lab: 4.3
Lab: 4.3
Lab: 4.4
Lab: 56
Lab: 88
Lab: 9
Lab: 9.8

## 2017-11-19 LAB — COMPREHENSIVE METABOLIC PANEL
Lab: 140
Lab: 3.4

## 2017-11-21 ENCOUNTER — Encounter: Admit: 2017-11-21 | Discharge: 2017-11-21

## 2017-11-21 ENCOUNTER — Ambulatory Visit: Admit: 2017-11-21 | Discharge: 2017-11-22 | Payer: Commercial Managed Care - PPO

## 2017-11-21 DIAGNOSIS — G35 Multiple sclerosis: Principal | ICD-10-CM

## 2017-11-21 DIAGNOSIS — A64 Unspecified sexually transmitted disease: ICD-10-CM

## 2017-11-21 DIAGNOSIS — G629 Polyneuropathy, unspecified: ICD-10-CM

## 2017-11-21 DIAGNOSIS — R51 Headache: ICD-10-CM

## 2017-11-21 DIAGNOSIS — N390 Urinary tract infection, site not specified: ICD-10-CM

## 2017-11-21 DIAGNOSIS — H547 Unspecified visual loss: ICD-10-CM

## 2017-11-21 DIAGNOSIS — I050 Rheumatic mitral stenosis: ICD-10-CM

## 2017-11-21 DIAGNOSIS — M1990 Unspecified osteoarthritis, unspecified site: Principal | ICD-10-CM

## 2017-11-22 ENCOUNTER — Encounter: Admit: 2017-11-22 | Discharge: 2017-11-22

## 2017-11-22 DIAGNOSIS — N390 Urinary tract infection, site not specified: ICD-10-CM

## 2017-11-22 DIAGNOSIS — G629 Polyneuropathy, unspecified: ICD-10-CM

## 2017-11-22 DIAGNOSIS — G35 Multiple sclerosis: ICD-10-CM

## 2017-11-22 DIAGNOSIS — I050 Rheumatic mitral stenosis: ICD-10-CM

## 2017-11-22 DIAGNOSIS — R51 Headache: ICD-10-CM

## 2017-11-22 DIAGNOSIS — A64 Unspecified sexually transmitted disease: ICD-10-CM

## 2017-11-22 DIAGNOSIS — M1990 Unspecified osteoarthritis, unspecified site: Principal | ICD-10-CM

## 2017-11-22 DIAGNOSIS — Z79899 Other long term (current) drug therapy: Principal | ICD-10-CM

## 2017-11-22 DIAGNOSIS — H547 Unspecified visual loss: ICD-10-CM

## 2018-01-28 ENCOUNTER — Encounter: Admit: 2018-01-28 | Discharge: 2018-01-28

## 2018-02-07 ENCOUNTER — Encounter: Admit: 2018-02-07 | Discharge: 2018-02-07

## 2018-02-10 ENCOUNTER — Encounter: Admit: 2018-02-10 | Discharge: 2018-02-10

## 2018-02-10 DIAGNOSIS — Z79899 Other long term (current) drug therapy: Principal | ICD-10-CM

## 2018-02-10 DIAGNOSIS — G35 Multiple sclerosis: ICD-10-CM

## 2018-02-10 LAB — CBC AND DIFF
Lab: 0.2
Lab: 1.4 — ABNORMAL LOW
Lab: 12
Lab: 27
Lab: 30
Lab: 4.3
Lab: 4.7
Lab: 42
Lab: 5.1
Lab: 59
Lab: 89
Lab: 9.2
Lab: 0.1
Lab: 0.4
Lab: 1
Lab: 14
Lab: 299
Lab: 3
Lab: 34
Lab: 7.5

## 2018-02-10 LAB — COMPREHENSIVE METABOLIC PANEL
Lab: 0.5
Lab: 0.7
Lab: 137
Lab: 14
Lab: 168 — ABNORMAL HIGH
Lab: 20
Lab: 3.4 — ABNORMAL LOW
Lab: 31
Lab: 37
Lab: 4.1
Lab: 7.9
Lab: 88
Lab: 9.1
Lab: 94
Lab: 99

## 2018-02-11 ENCOUNTER — Encounter: Admit: 2018-02-11 | Discharge: 2018-02-11

## 2018-02-11 DIAGNOSIS — G35 Multiple sclerosis: ICD-10-CM

## 2018-02-11 DIAGNOSIS — Z79899 Other long term (current) drug therapy: Principal | ICD-10-CM

## 2018-02-12 ENCOUNTER — Encounter: Admit: 2018-02-12 | Discharge: 2018-02-12

## 2018-02-12 DIAGNOSIS — Z79899 Other long term (current) drug therapy: Principal | ICD-10-CM

## 2018-02-12 DIAGNOSIS — G35 Multiple sclerosis: ICD-10-CM

## 2018-02-17 ENCOUNTER — Encounter: Admit: 2018-02-17 | Discharge: 2018-02-17

## 2018-02-17 ENCOUNTER — Ambulatory Visit: Admit: 2018-02-17 | Discharge: 2018-02-18 | Payer: Commercial Managed Care - PPO

## 2018-02-17 DIAGNOSIS — N390 Urinary tract infection, site not specified: ICD-10-CM

## 2018-02-17 DIAGNOSIS — G35 Multiple sclerosis: ICD-10-CM

## 2018-02-17 DIAGNOSIS — G629 Polyneuropathy, unspecified: ICD-10-CM

## 2018-02-17 DIAGNOSIS — H547 Unspecified visual loss: ICD-10-CM

## 2018-02-17 DIAGNOSIS — A64 Unspecified sexually transmitted disease: ICD-10-CM

## 2018-02-17 DIAGNOSIS — I050 Rheumatic mitral stenosis: ICD-10-CM

## 2018-02-17 DIAGNOSIS — M1990 Unspecified osteoarthritis, unspecified site: Principal | ICD-10-CM

## 2018-02-17 DIAGNOSIS — R51 Headache: ICD-10-CM

## 2018-02-17 MED ORDER — METHYLPREDNISOLONE SOD SUC(PF) 125 MG/2 ML IJ SOLR
100 mg | Freq: Once | INTRAVENOUS | 0 refills | Status: CP
Start: 2018-02-17 — End: ?
  Administered 2018-02-17: 17:00:00 100 mg via INTRAVENOUS

## 2018-02-17 MED ORDER — ACETAMINOPHEN 500 MG PO TAB
500 mg | Freq: Once | ORAL | 0 refills | Status: CP
Start: 2018-02-17 — End: ?
  Administered 2018-02-17: 17:00:00 500 mg via ORAL

## 2018-02-17 MED ORDER — ACETAMINOPHEN 500 MG PO TAB
500 mg | Freq: Once | ORAL | 0 refills | Status: CN
Start: 2018-02-17 — End: ?

## 2018-02-17 MED ORDER — DIPHENHYDRAMINE HCL 25 MG PO CAP
25 mg | Freq: Once | ORAL | 0 refills | Status: CN
Start: 2018-02-17 — End: ?

## 2018-02-17 MED ORDER — METHYLPREDNISOLONE SOD SUC(PF) 125 MG/2 ML IJ SOLR
100 mg | Freq: Once | INTRAVENOUS | 0 refills | Status: CN
Start: 2018-02-17 — End: ?

## 2018-02-17 MED ORDER — OCRELIZUMAB 600 MG IVPB
600 mg | Freq: Once | INTRAVENOUS | 0 refills | Status: CP
Start: 2018-02-17 — End: ?
  Administered 2018-02-17 (×2): 600 mg via INTRAVENOUS

## 2018-02-17 MED ORDER — OCRELIZUMAB 600 MG IVPB
600 mg | Freq: Once | INTRAVENOUS | 0 refills | Status: CN
Start: 2018-02-17 — End: ?

## 2018-02-17 MED ORDER — DIPHENHYDRAMINE HCL 25 MG PO CAP
25 mg | Freq: Once | ORAL | 0 refills | Status: CP
Start: 2018-02-17 — End: ?
  Administered 2018-02-17: 17:00:00 25 mg via ORAL

## 2018-02-18 DIAGNOSIS — G35 Multiple sclerosis: Principal | ICD-10-CM

## 2018-02-20 ENCOUNTER — Encounter: Admit: 2018-02-20 | Discharge: 2018-02-20

## 2018-02-20 ENCOUNTER — Ambulatory Visit: Admit: 2018-02-20 | Discharge: 2018-02-21 | Payer: Commercial Managed Care - PPO

## 2018-02-20 DIAGNOSIS — N390 Urinary tract infection, site not specified: ICD-10-CM

## 2018-02-20 DIAGNOSIS — E559 Vitamin D deficiency, unspecified: ICD-10-CM

## 2018-02-20 DIAGNOSIS — G35 Multiple sclerosis: ICD-10-CM

## 2018-02-20 DIAGNOSIS — I050 Rheumatic mitral stenosis: ICD-10-CM

## 2018-02-20 DIAGNOSIS — H547 Unspecified visual loss: ICD-10-CM

## 2018-02-20 DIAGNOSIS — M1990 Unspecified osteoarthritis, unspecified site: Principal | ICD-10-CM

## 2018-02-20 DIAGNOSIS — G629 Polyneuropathy, unspecified: ICD-10-CM

## 2018-02-20 DIAGNOSIS — A64 Unspecified sexually transmitted disease: ICD-10-CM

## 2018-02-20 DIAGNOSIS — R51 Headache: ICD-10-CM

## 2018-02-21 ENCOUNTER — Encounter: Admit: 2018-02-21 | Discharge: 2018-02-21

## 2018-02-21 DIAGNOSIS — G35 Multiple sclerosis: ICD-10-CM

## 2018-02-21 DIAGNOSIS — Z79899 Other long term (current) drug therapy: Principal | ICD-10-CM

## 2018-03-10 LAB — 25-OH VITAMIN D (D2 + D3): Lab: 53

## 2018-04-02 ENCOUNTER — Encounter: Admit: 2018-04-02 | Discharge: 2018-04-02

## 2018-04-02 DIAGNOSIS — G35 Multiple sclerosis: Secondary | ICD-10-CM

## 2018-04-02 DIAGNOSIS — E559 Vitamin D deficiency, unspecified: Secondary | ICD-10-CM

## 2018-05-06 ENCOUNTER — Encounter: Admit: 2018-05-06 | Discharge: 2018-05-06

## 2018-05-12 ENCOUNTER — Encounter: Admit: 2018-05-12 | Discharge: 2018-05-12

## 2018-06-09 ENCOUNTER — Encounter: Admit: 2018-06-09 | Discharge: 2018-06-09

## 2018-06-10 LAB — CBC AND DIFF
Lab: 0
Lab: 0.3
Lab: 0.5
Lab: 0.5
Lab: 0.9 — ABNORMAL LOW
Lab: 13
Lab: 33 — ABNORMAL HIGH
Lab: 4.4
Lab: 5.8
Lab: 8.8 — ABNORMAL HIGH
Lab: 9.7
Lab: 93

## 2018-06-10 LAB — COMPREHENSIVE METABOLIC PANEL
Lab: 104
Lab: 139
Lab: 28
Lab: 3.4 — ABNORMAL LOW

## 2018-06-11 ENCOUNTER — Encounter: Admit: 2018-06-11 | Discharge: 2018-06-11

## 2018-06-11 DIAGNOSIS — G35 Multiple sclerosis: ICD-10-CM

## 2018-06-11 DIAGNOSIS — Z79899 Other long term (current) drug therapy: Principal | ICD-10-CM

## 2018-06-12 ENCOUNTER — Encounter: Admit: 2018-06-12 | Discharge: 2018-06-12

## 2018-06-12 DIAGNOSIS — G35 Multiple sclerosis: ICD-10-CM

## 2018-06-12 DIAGNOSIS — Z79899 Other long term (current) drug therapy: Principal | ICD-10-CM

## 2018-06-13 ENCOUNTER — Encounter: Admit: 2018-06-13 | Discharge: 2018-06-13

## 2018-06-16 ENCOUNTER — Encounter: Admit: 2018-06-16 | Discharge: 2018-06-16

## 2018-06-17 ENCOUNTER — Encounter: Admit: 2018-06-17 | Discharge: 2018-06-17

## 2018-06-17 ENCOUNTER — Ambulatory Visit: Admit: 2018-06-17 | Discharge: 2018-06-18 | Payer: Commercial Managed Care - PPO

## 2018-06-17 DIAGNOSIS — N390 Urinary tract infection, site not specified: ICD-10-CM

## 2018-06-17 DIAGNOSIS — G35 Multiple sclerosis: ICD-10-CM

## 2018-06-17 DIAGNOSIS — R51 Headache: ICD-10-CM

## 2018-06-17 DIAGNOSIS — I050 Rheumatic mitral stenosis: ICD-10-CM

## 2018-06-17 DIAGNOSIS — M1990 Unspecified osteoarthritis, unspecified site: Principal | ICD-10-CM

## 2018-06-17 DIAGNOSIS — G629 Polyneuropathy, unspecified: ICD-10-CM

## 2018-06-17 DIAGNOSIS — H547 Unspecified visual loss: ICD-10-CM

## 2018-06-17 DIAGNOSIS — A64 Unspecified sexually transmitted disease: ICD-10-CM

## 2018-06-18 DIAGNOSIS — G35 Multiple sclerosis: Principal | ICD-10-CM

## 2018-06-20 ENCOUNTER — Encounter: Admit: 2018-06-20 | Discharge: 2018-06-20

## 2018-06-23 ENCOUNTER — Encounter: Admit: 2018-06-23 | Discharge: 2018-06-23

## 2018-06-23 MED ORDER — CYCLOBENZAPRINE 10 MG PO TAB
10 mg | ORAL_TABLET | Freq: Two times a day (BID) | ORAL | 2 refills | 21.00000 days | Status: AC | PRN
Start: 2018-06-23 — End: ?

## 2018-06-24 MED ORDER — PREGABALIN 75 MG PO CAP
75 mg | ORAL_CAPSULE | Freq: Three times a day (TID) | ORAL | 5 refills | Status: DC
Start: 2018-06-24 — End: 2018-09-03

## 2018-06-24 MED ORDER — ALPRAZOLAM 0.25 MG PO TAB
.25 mg | ORAL_TABLET | Freq: Every day | ORAL | 0 refills | Status: DC | PRN
Start: 2018-06-24 — End: 2019-09-25

## 2018-08-12 ENCOUNTER — Encounter: Admit: 2018-08-12 | Discharge: 2018-08-12

## 2018-09-02 ENCOUNTER — Encounter: Admit: 2018-09-02 | Discharge: 2018-09-02

## 2018-09-03 ENCOUNTER — Ambulatory Visit: Admit: 2018-09-03 | Discharge: 2018-09-03

## 2018-09-03 ENCOUNTER — Encounter: Admit: 2018-09-03 | Discharge: 2018-09-03

## 2018-09-03 DIAGNOSIS — A64 Unspecified sexually transmitted disease: Secondary | ICD-10-CM

## 2018-09-03 DIAGNOSIS — I050 Rheumatic mitral stenosis: Secondary | ICD-10-CM

## 2018-09-03 DIAGNOSIS — G35 Multiple sclerosis: Secondary | ICD-10-CM

## 2018-09-03 DIAGNOSIS — H547 Unspecified visual loss: Secondary | ICD-10-CM

## 2018-09-03 DIAGNOSIS — N390 Urinary tract infection, site not specified: Secondary | ICD-10-CM

## 2018-09-03 DIAGNOSIS — Z79899 Other long term (current) drug therapy: Secondary | ICD-10-CM

## 2018-09-03 DIAGNOSIS — R51 Headache: Secondary | ICD-10-CM

## 2018-09-03 DIAGNOSIS — G629 Polyneuropathy, unspecified: Secondary | ICD-10-CM

## 2018-09-03 DIAGNOSIS — M1990 Unspecified osteoarthritis, unspecified site: Secondary | ICD-10-CM

## 2018-09-03 LAB — CBC AND DIFF
Lab: 0 10*3/uL (ref 0–0.20)
Lab: 0.1 10*3/uL (ref 0–0.45)
Lab: 3.8 10*3/uL — ABNORMAL LOW (ref 4.5–11.0)

## 2018-09-03 LAB — COMPREHENSIVE METABOLIC PANEL
Lab: 104 U/L (ref 25–110)
Lab: 144 MMOL/L (ref 137–147)
Lab: 19 U/L (ref 7–40)
Lab: 21 U/L (ref 7–56)
Lab: 29 MMOL/L (ref 21–30)
Lab: 3.7 MMOL/L (ref 3.5–5.1)
Lab: 4.3 g/dL (ref 3.5–5.0)
Lab: 7 K/UL (ref 3–12)
Lab: >60 mL/min (ref >60)
Lab: >60 mL/min (ref >60)

## 2018-09-03 NOTE — Progress Notes
Date of Service: 09/03/2018    Subjective:             Monica Burnett is a 53 y.o. female.    History of Present Illness  A couple months ago she experienced worsening in her walking as well as more cognitive symptoms. This got better with time. Her husband has noticed some worsening cognitive symptoms over time.  Here to review MRI head.         Review of Systems   Gastrointestinal: Positive for constipation.   Endocrine: Positive for heat intolerance.   Genitourinary: Positive for urgency.   Musculoskeletal: Positive for arthralgias.   Allergic/Immunologic: Positive for environmental allergies and immunocompromised state.   Neurological: Positive for weakness.   Psychiatric/Behavioral: Positive for confusion and decreased concentration.   All other systems reviewed and are negative.        Objective:         ??? ALPRAZolam (XANAX) 0.25 mg tablet Take one tablet by mouth daily as needed for Anxiety.   ??? cyclobenzaprine (FLEXERIL) 10 mg tablet Take one tablet by mouth twice daily as needed for Muscle Cramps.   ??? ocrelizumab (OCREVUS IV) Administer 600 mg through vein every 180 days.     Vitals:    09/03/18 1223   BP: 114/74   BP Source: Arm, Right Upper   Patient Position: Sitting   Pulse: 80   Resp: 16   Weight: 62.2 kg (137 lb 3.2 oz)   Height: 162.6 cm (64)   PainSc: Zero     Body mass index is 23.55 kg/m???.     Physical Exam  Depression Screening was performed on Monica Burnett in clinic today. Based on the score of 0, no follow up action or recommendations are necessary at this time.  Examination    Mental Status Exam: Patient is alert and oriented in all four spheres. There is normal short and long term memory. Language functions are normal both for comprehension and expression. Slowed processing speed.  Speech and Language: nl  HEENT: Normal.  Extremities: no leg edema  Cranial Nerve Exam:   Cranial Nerve II Right Left   Visual Acuity 20/13 GLASSES 20/13-3 GLASSES   Pupil     Visual Fields     Fundoscopic Color Vision       Cranial Nerves III-XII Right Left   III, IV, VI (EOM's) x x   V x x   VII x x   VIII x x   IX, X x x   XI x x   XII x x     Nystagmus: None; saccadic intrusions    Motor:    R L   R L   Neck flexors    Hip flexors 4 4   Neck extensors    Hip abductors     Shoulder Abductors 5 5  Hip extensors 5 5   Elbow flexors 5 5  Hip adductors     Elbow extensors 5 5  Knee flexors 5 5   Wrist flexors 5 5  Knee extensors 5 5   Wrist extensors 5 5  Ankle dorsiflexors 5 5   Finger flexors 5 5  Ankle plantar flexors 5 5   Finger abductors 5 5  Ankle inversion      5 5  Ankle eversion         Toe flexors         Toe extensors       Bulk and Tone:  Upper Extremity R L   Atrophy No No   Increased Tone No No   Fasciculations No No     Lower Extremity R L   Atrophy No No   Increased Tone No No   Fasciculations No No     Cerebellar/Fine Motor R L   Finger nose finger Normal Normal   Heel to shin Normal Normal   Finger tapping     Foot tapping     Rapid alternating movements Normal Normal     Gait: Wide based  Ambulation Index: 8.68  Romberg: mild sway    9 HOLE PEG  LH 39.94  RH 22.38             Assessment and Plan:    Problem   Multiple Sclerosis (Hcc)    Multiple Sclerosis Subtype: Relapsing remitting, last relapse 2015, 2018, 2020  Symptom onset: 2013  Date of Diagnosis: 2013  Current DMD: Ocrevus (08/2017 -)  Prior Medication failures: Copaxone,tecfidera (not really a failure), Aubagio       Last MRI 2020  MRI Brain: 2-3 interval new lesions including one small in right frontal subcortical white matter and larger one in left cerebellar hemisphere.    MRI C-Spine: (Compared with 2015)  1. ???Possible new right-sided cord lesion at C2 with essentially unchanged scattered additional short segment cervical spinal cord signal abnormalities consistent with clinically reported multiple sclerosis. No enhancing lesion is identified.  2. 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    LP positive oligoclonal bands, IgG index 1.69, IgG synthesis 11.2          Multiple sclerosis (HCC)  She recently had a mild relapse, which she has recovered from. She has a few mild new lesions. While this does represent some mild disease activity, we ultimately decided this wasn't significant enough to take on risks with higher efficacy therapies Julaine Hua, HSCT).  Continue Ocrevus.  Continue lab monitoring.  Plan for MRI brain and c-spine in 6 months for close surveillance.    Return to clinic in 6 months with MRI.  Demetrio Lapping, DO  Neuroimmunology Fellow      ATTESTATION  I personally performed the key portions of the E/M visit, discussed the case with the Fellow, and concur with the fellow's evaluation and plan stated above. I have altered the note to better reflect my evaluation and plan.    Lynnell Jude MD  Director of [facility] for [facility]

## 2018-09-04 ENCOUNTER — Encounter: Admit: 2018-09-04 | Discharge: 2018-09-04

## 2018-09-04 DIAGNOSIS — G629 Polyneuropathy, unspecified: Secondary | ICD-10-CM

## 2018-09-04 DIAGNOSIS — R51 Headache: Secondary | ICD-10-CM

## 2018-09-04 DIAGNOSIS — N390 Urinary tract infection, site not specified: Secondary | ICD-10-CM

## 2018-09-04 DIAGNOSIS — I050 Rheumatic mitral stenosis: Secondary | ICD-10-CM

## 2018-09-04 DIAGNOSIS — M1990 Unspecified osteoarthritis, unspecified site: Secondary | ICD-10-CM

## 2018-09-04 DIAGNOSIS — H547 Unspecified visual loss: Secondary | ICD-10-CM

## 2018-09-04 DIAGNOSIS — A64 Unspecified sexually transmitted disease: Secondary | ICD-10-CM

## 2018-09-04 DIAGNOSIS — G35 Multiple sclerosis: Secondary | ICD-10-CM

## 2018-09-08 ENCOUNTER — Ambulatory Visit: Admit: 2018-09-08 | Discharge: 2018-09-09

## 2018-09-08 ENCOUNTER — Encounter: Admit: 2018-09-08 | Discharge: 2018-09-08

## 2018-09-08 DIAGNOSIS — R51 Headache: Secondary | ICD-10-CM

## 2018-09-08 DIAGNOSIS — G35 Multiple sclerosis: Secondary | ICD-10-CM

## 2018-09-08 DIAGNOSIS — N390 Urinary tract infection, site not specified: Secondary | ICD-10-CM

## 2018-09-08 DIAGNOSIS — H547 Unspecified visual loss: Secondary | ICD-10-CM

## 2018-09-08 DIAGNOSIS — I050 Rheumatic mitral stenosis: Secondary | ICD-10-CM

## 2018-09-08 DIAGNOSIS — A64 Unspecified sexually transmitted disease: Secondary | ICD-10-CM

## 2018-09-08 DIAGNOSIS — M1990 Unspecified osteoarthritis, unspecified site: Secondary | ICD-10-CM

## 2018-09-08 DIAGNOSIS — G629 Polyneuropathy, unspecified: Secondary | ICD-10-CM

## 2018-09-08 MED ORDER — DIPHENHYDRAMINE HCL 25 MG PO CAP
25 mg | Freq: Once | ORAL | 0 refills | Status: CP
Start: 2018-09-08 — End: ?
  Administered 2018-09-08: 14:00:00 25 mg via ORAL

## 2018-09-08 MED ORDER — OCRELIZUMAB 600 MG IVPB
600 mg | Freq: Once | INTRAVENOUS | 0 refills | Status: CN
Start: 2018-09-08 — End: ?

## 2018-09-08 MED ORDER — ACETAMINOPHEN 500 MG PO TAB
500 mg | Freq: Once | ORAL | 0 refills | Status: CN
Start: 2018-09-08 — End: ?

## 2018-09-08 MED ORDER — METHYLPREDNISOLONE SOD SUC(PF) 125 MG/2 ML IJ SOLR
100 mg | Freq: Once | INTRAVENOUS | 0 refills | Status: CN
Start: 2018-09-08 — End: ?

## 2018-09-08 MED ORDER — OCRELIZUMAB 600 MG IVPB
600 mg | Freq: Once | INTRAVENOUS | 0 refills | Status: CP
Start: 2018-09-08 — End: ?
  Administered 2018-09-08 (×2): 600 mg via INTRAVENOUS

## 2018-09-08 MED ORDER — DIPHENHYDRAMINE HCL 25 MG PO CAP
25 mg | Freq: Once | ORAL | 0 refills | Status: CN
Start: 2018-09-08 — End: ?

## 2018-09-08 MED ORDER — ACETAMINOPHEN 500 MG PO TAB
500 mg | Freq: Once | ORAL | 0 refills | Status: CP
Start: 2018-09-08 — End: ?
  Administered 2018-09-08: 14:00:00 500 mg via ORAL

## 2018-09-08 MED ORDER — METHYLPREDNISOLONE SOD SUC(PF) 125 MG/2 ML IJ SOLR
100 mg | Freq: Once | INTRAVENOUS | 0 refills | Status: CP
Start: 2018-09-08 — End: ?
  Administered 2018-09-08: 14:00:00 100 mg via INTRAVENOUS

## 2018-09-08 NOTE — Progress Notes
Pt tolerated infusion.  One hour observation in progress.

## 2018-09-25 ENCOUNTER — Encounter: Admit: 2018-09-25 | Discharge: 2018-09-25

## 2018-11-18 ENCOUNTER — Encounter: Admit: 2018-11-18 | Discharge: 2018-11-18

## 2018-11-18 DIAGNOSIS — G35 Multiple sclerosis: Secondary | ICD-10-CM

## 2018-11-18 DIAGNOSIS — Z79899 Other long term (current) drug therapy: Secondary | ICD-10-CM

## 2018-11-18 NOTE — Progress Notes
Patient calls asking for lab orders to be faxed to [place]: [phone], Orders faxed to [phone]

## 2018-12-09 LAB — CBC AND DIFF
Lab: 0
Lab: 0.1
Lab: 0.4
Lab: 0.5
Lab: 1.2 mL/min — ABNORMAL LOW (ref >60)
Lab: 1.8 U/L (ref 7–56)
Lab: 12 U/L (ref 7–40)
Lab: 12 mL/min — ABNORMAL HIGH (ref >60)
Lab: 247 g/dL (ref 3.5–5.0)
Lab: 3
Lab: 3.7 MMOL/L (ref 98–110)
Lab: 34 (ref 3–12)
Lab: 50 MMOL/L (ref 21–30)
Lab: 9.5 U/L (ref 25–110)

## 2018-12-09 LAB — COMPREHENSIVE METABOLIC PANEL
Lab: 0.4
Lab: 0.8 FL (ref 7–11)
Lab: 103 g/dL (ref 32.0–36.0)
Lab: 136 — ABNORMAL HIGH
Lab: 14 K/UL — ABNORMAL LOW (ref 150–400)
Lab: 141 FL (ref 80–100)
Lab: 3.4 pg — ABNORMAL LOW (ref 26–34)
Lab: 30 % (ref 11–15)
Lab: 36
Lab: 7.5
Lab: 8.9
Lab: 80
Lab: 92

## 2018-12-10 ENCOUNTER — Encounter: Admit: 2018-12-10 | Discharge: 2018-12-10

## 2018-12-11 ENCOUNTER — Encounter: Admit: 2018-12-11 | Discharge: 2018-12-11

## 2018-12-11 NOTE — Telephone Encounter
Patient calls to let Dr. Donnal Debar know that she has tested positive for COVID-19 yesterday. Reports that she did see her primary care physician who ZO:XWRUE, hydroxychloroquine, and zinc. Educated her on possibility of psudo-exacerbation symptoms due to her having COVID-19. Patient verbalized understanding. Dr. Donnal Debar was made aware

## 2018-12-16 ENCOUNTER — Encounter

## 2018-12-16 DIAGNOSIS — G35 Multiple sclerosis: Secondary | ICD-10-CM

## 2018-12-16 DIAGNOSIS — Z79899 Other long term (current) drug therapy: Secondary | ICD-10-CM

## 2019-01-13 ENCOUNTER — Encounter: Admit: 2019-01-13 | Discharge: 2019-01-13

## 2019-01-27 ENCOUNTER — Encounter: Admit: 2019-01-27 | Discharge: 2019-01-27

## 2019-01-27 NOTE — Progress Notes
01/13/2019:  I spoke with Dr. Donnal Debar in regards to patient's Ocrevus infusions and her wanting to move them to a closer facility. Since patient is coming in to see Dr. Donnal Debar 03-06-19 with MRI same day, Dr. Donnal Debar is ok with holding off on sending these orders to [facility]. Dr. Donnal Debar was made aware that if she does decide to keep her on Ocrevus we would then start the [place] outside [place] which could delay her therapy 1-2 weeks. It was mentioned by patient and Dr. Donnal Debar that she might be changing her to [place].

## 2019-03-06 ENCOUNTER — Ambulatory Visit: Admit: 2019-03-06 | Discharge: 2019-03-06 | Payer: Commercial Managed Care - PPO

## 2019-03-06 ENCOUNTER — Encounter: Admit: 2019-03-06 | Discharge: 2019-03-06

## 2019-03-06 ENCOUNTER — Ambulatory Visit: Admit: 2019-03-06 | Discharge: 2019-03-06

## 2019-03-06 DIAGNOSIS — U071 COVID-19 virus detected: Secondary | ICD-10-CM

## 2019-03-06 DIAGNOSIS — G35 Multiple sclerosis: Secondary | ICD-10-CM

## 2019-03-06 DIAGNOSIS — M1990 Unspecified osteoarthritis, unspecified site: Secondary | ICD-10-CM

## 2019-03-06 DIAGNOSIS — G43109 Migraine with aura, not intractable, without status migrainosus: Secondary | ICD-10-CM

## 2019-03-06 DIAGNOSIS — G629 Polyneuropathy, unspecified: Secondary | ICD-10-CM

## 2019-03-06 DIAGNOSIS — N390 Urinary tract infection, site not specified: Secondary | ICD-10-CM

## 2019-03-06 DIAGNOSIS — A64 Unspecified sexually transmitted disease: Secondary | ICD-10-CM

## 2019-03-06 DIAGNOSIS — H547 Unspecified visual loss: Secondary | ICD-10-CM

## 2019-03-06 DIAGNOSIS — Z79899 Other long term (current) drug therapy: Secondary | ICD-10-CM

## 2019-03-06 DIAGNOSIS — R519 Generalized headaches: Secondary | ICD-10-CM

## 2019-03-06 DIAGNOSIS — I050 Rheumatic mitral stenosis: Secondary | ICD-10-CM

## 2019-03-06 MED ORDER — UBROGEPANT 50 MG PO TAB
50 mg | ORAL_TABLET | Freq: Every day | ORAL | 1 refills | 30.00000 days | Status: DC | PRN
Start: 2019-03-06 — End: 2019-09-04

## 2019-03-06 MED ORDER — GADOBENATE DIMEGLUMINE 529 MG/ML (0.1MMOL/0.2ML) IV SOLN
12 mL | Freq: Once | INTRAVENOUS | 0 refills | Status: CP
Start: 2019-03-06 — End: ?
  Administered 2019-03-06: 17:00:00 12 mL via INTRAVENOUS

## 2019-03-06 NOTE — Assessment & Plan Note
-   discussed good oral hydration and sleep hygiene  - will start Ubrelvy 50 mg PRN for headaches as an abortive therapy

## 2019-03-07 LAB — COMPREHENSIVE METABOLIC PANEL
Lab: 103 MMOL/L (ref 98–110)
Lab: 14 mg/dL (ref 7–25)
Lab: 143 MMOL/L (ref 137–147)
Lab: 18 U/L — ABNORMAL LOW (ref 7–40)
Lab: 7.5 g/dL (ref 6.0–8.0)

## 2019-03-07 LAB — CBC AND DIFF
Lab: 12 % (ref 11–15)
Lab: 14 g/dL — ABNORMAL HIGH (ref 12.0–15.0)
Lab: 4 K/UL — ABNORMAL LOW (ref >60)
Lab: 5.4 K/UL (ref 4.5–11.0)
Lab: 89 FL (ref 80–100)

## 2019-03-07 LAB — IMMUNOGLOBULINS-IGA,IGG,IGM: Lab: 801 mg/dL (ref 762–1488)

## 2019-03-09 ENCOUNTER — Encounter: Admit: 2019-03-09 | Discharge: 2019-03-09

## 2019-03-09 DIAGNOSIS — U071 COVID-19 virus detected: Secondary | ICD-10-CM

## 2019-03-09 DIAGNOSIS — H547 Unspecified visual loss: Secondary | ICD-10-CM

## 2019-03-09 DIAGNOSIS — A64 Unspecified sexually transmitted disease: Secondary | ICD-10-CM

## 2019-03-09 DIAGNOSIS — G629 Polyneuropathy, unspecified: Secondary | ICD-10-CM

## 2019-03-09 DIAGNOSIS — R519 Generalized headaches: Secondary | ICD-10-CM

## 2019-03-09 DIAGNOSIS — M1990 Unspecified osteoarthritis, unspecified site: Secondary | ICD-10-CM

## 2019-03-09 DIAGNOSIS — N390 Urinary tract infection, site not specified: Secondary | ICD-10-CM

## 2019-03-09 DIAGNOSIS — I050 Rheumatic mitral stenosis: Secondary | ICD-10-CM

## 2019-03-09 DIAGNOSIS — G35 Multiple sclerosis: Secondary | ICD-10-CM

## 2019-03-10 ENCOUNTER — Encounter: Admit: 2019-03-10 | Discharge: 2019-03-10

## 2019-03-10 NOTE — Progress Notes
Med [facility] requesting medical records from December visit. Faxed to [phone]

## 2019-03-10 NOTE — Telephone Encounter
Monica Burnett with [facility] called to obtain orders for Ocrevus and stated she needs to get medication from [facility] [employer]. Called and spoke with Manuela Schwartz the case manager who gave [employer]'s number and to call them to set up shipment of medication. Called [employer] at [phone]  Stated they are the insurance company and she would need to get medication through [place]. Called [facility] advised to send over orders, authorization and infusion site information. Faxed to [phone], faxed orders to [facility] [place] attention Monica Burnett at [phone].    Called and spoke with Kirke Shaggy to let her know what was going on all orders have been sent to appropriate places.

## 2019-03-16 ENCOUNTER — Encounter: Admit: 2019-03-16 | Discharge: 2019-03-16

## 2019-03-16 NOTE — Telephone Encounter
Confirming that patients Ocrevus Drug will be shipped to [facility] for upcoming infusion.

## 2019-04-27 ENCOUNTER — Encounter: Admit: 2019-04-27 | Discharge: 2019-04-27

## 2019-04-27 NOTE — Telephone Encounter
Patient reports over the weekend having increased fatigue, bilateral leg heaviness, hard to pick up her legs,struggle to walk, in general things are difficult to do, unable to empty her bladder all the way, slight incontinence. Patient is contacting her PCP to have UA and culture done. She will let us know once this is completed or once results are back to review.

## 2019-04-29 ENCOUNTER — Encounter: Admit: 2019-04-29 | Discharge: 2019-04-29

## 2019-04-29 DIAGNOSIS — G35 Multiple sclerosis: Secondary | ICD-10-CM

## 2019-04-29 DIAGNOSIS — R29898 Other symptoms and signs involving the musculoskeletal system: Secondary | ICD-10-CM

## 2019-04-29 DIAGNOSIS — R3989 Other symptoms and signs involving the genitourinary system: Secondary | ICD-10-CM

## 2019-06-11 ENCOUNTER — Encounter: Admit: 2019-06-11 | Discharge: 2019-06-11

## 2019-06-11 NOTE — Telephone Encounter
Patient is having more problem ambulating with just a cane, feels it is time to start looking into getting a power wheelchair.She has contacted her insurance and would like to start this process and have information sent to [employer] for a power wheelchair. Patient is aware her next office visit is not scheduled until 09-04-19 which will need face to face documentation. At this time she is ok with waiting, currently she has met her deductable and insurance will pay 100%. Last visit note, demographics, insurance all sent to [place] with [employer]

## 2019-06-24 ENCOUNTER — Encounter: Admit: 2019-06-24 | Discharge: 2019-06-24

## 2019-06-24 NOTE — Telephone Encounter
Patient calls stating her legs feel very heavy, making it difficult for her to walk. Also reports having a HA, slight urinary frequency. Symptoms began yesterday evening.

## 2019-06-24 NOTE — Progress Notes
A prior authorization for Monica Burnett was previously submitted to the patients prescription insurance [employer]. The prior authorization PA # [id number]  was approved from 06-24-2019 approved for 1 year .  Approval letter faxed to [facility] at [phone]       [facility], LPN

## 2019-09-04 ENCOUNTER — Ambulatory Visit: Admit: 2019-09-04 | Discharge: 2019-09-04 | Payer: Commercial Managed Care - PPO

## 2019-09-04 ENCOUNTER — Encounter: Admit: 2019-09-04 | Discharge: 2019-09-04

## 2019-09-04 DIAGNOSIS — H547 Unspecified visual loss: Secondary | ICD-10-CM

## 2019-09-04 DIAGNOSIS — U071 COVID-19 virus detected: Secondary | ICD-10-CM

## 2019-09-04 DIAGNOSIS — Z79899 Other long term (current) drug therapy: Secondary | ICD-10-CM

## 2019-09-04 DIAGNOSIS — G35 Multiple sclerosis: Secondary | ICD-10-CM

## 2019-09-04 DIAGNOSIS — N390 Urinary tract infection, site not specified: Secondary | ICD-10-CM

## 2019-09-04 DIAGNOSIS — M1990 Unspecified osteoarthritis, unspecified site: Secondary | ICD-10-CM

## 2019-09-04 DIAGNOSIS — I050 Rheumatic mitral stenosis: Secondary | ICD-10-CM

## 2019-09-04 DIAGNOSIS — G43109 Migraine with aura, not intractable, without status migrainosus: Secondary | ICD-10-CM

## 2019-09-04 DIAGNOSIS — G629 Polyneuropathy, unspecified: Secondary | ICD-10-CM

## 2019-09-04 DIAGNOSIS — R519 Generalized headaches: Secondary | ICD-10-CM

## 2019-09-04 DIAGNOSIS — A64 Unspecified sexually transmitted disease: Secondary | ICD-10-CM

## 2019-09-04 MED ORDER — UBRELVY 50 MG PO TAB
ORAL_TABLET | Freq: Every day | ORAL | 1 refills | 30.00000 days | Status: AC | PRN
Start: 2019-09-04 — End: ?

## 2019-09-04 NOTE — Assessment & Plan Note
Stable on [place]

## 2019-09-04 NOTE — Telephone Encounter
Refill request for Monica Burnett, last office visit 03-06-19 refills sent

## 2019-09-04 NOTE — Progress Notes
Date of Service: 09/04/2019    Subjective:             Monica Burnett is a 54 y.o. female.    History of Present Illness  She is noticing a slowly worsening gait. She is using her cane more than before and uses a wheelchair some of the time outside the home. Takes ocrevus faithfully and reports no problems with the medication.  She is due for the next dose and will get it in about 3 days.   She is taking ubrelvy for her migraines and it is working well.          Review of Systems   Constitutional: Positive for activity change and fatigue.   Endocrine: Positive for cold intolerance and heat intolerance.   Musculoskeletal: Positive for gait problem.   Neurological: Positive for weakness and headaches.   Hematological: Bruises/bleeds easily.   All other systems reviewed and are negative.        Objective:         ? ALPRAZolam (XANAX) 0.25 mg tablet Take one tablet by mouth daily as needed for Anxiety.   ? cyclobenzaprine (FLEXERIL) 10 mg tablet Take one tablet by mouth twice daily as needed for Muscle Cramps.   ? ocrelizumab (OCREVUS IV) Administer 600 mg through vein every 180 days.   ? UBRELVY 50 mg tablet TAKE ONE TABLET BY MOUTH DAILY AS NEEDED. MAY REPEAT ONCE AFTER 2 HOURS BASED ON RESPONSE.     Vitals:    09/04/19 1252   BP: 118/77   BP Source: Arm, Right Upper   Patient Position: Sitting   Pulse: 69   Resp: 18   Weight: 62.4 kg (137 lb 9.6 oz)   Height: 162.6 cm (64)   PainSc: Zero     Body mass index is 23.62 kg/m?Marland Kitchen     Physical Exam  Depression Screening was performed on Monica Burnett in clinic today. Based on the score of 4, no follow up action or recommendations are necessary at this time.  Examination    Mental Status Exam: Patient is alert and oriented in all four spheres. There is normal short and long term memory. Language functions are normal both for comprehension and expression.  Speech and Language: nl  HEENT: Normal.  Extremities:   Cranial Nerve Exam:   Cranial Nerve II Right Left   Visual Acuity 20/20 glasses 20/20 glasses   Pupil     Visual Fields     Fundoscopic     Color Vision       Cranial Nerves III-XII Right Left   III, IV, VI (EOM's) nl nl   V nl nl   VII nl nl   VIII nl nl   IX, X nl nl   XI nl nl   XII nl nl     Nystagmus: None    Motor:    R L   R L   Neck flexors    Hip flexors 4 4-   Neck extensors    Hip abductors     Shoulder Abductors 5 5  Hip extensors 5 5   Elbow flexors 5 5  Hip adductors     Elbow extensors 5 5  Knee flexors 5 4   Wrist flexors 5 5  Knee extensors 5 5   Wrist extensors 5 5  Ankle dorsiflexors 5 4+   Finger flexors 5 5  Ankle plantar flexors 5 5   Finger abductors 5 5  Ankle inversion  5 5  Ankle eversion         Toe flexors         Toe extensors       Bulk and Tone:    Upper Extremity R L   Atrophy No No   Increased Tone No No   Fasciculations No No     Lower Extremity R L   Atrophy No No   Increased Tone No No   Fasciculations No No     Cerebellar/Fine Motor R L   Finger nose finger Normal Normal   Heel to shin     Finger tapping     Foot tapping     Rapid alternating movements Normal Normal     Gait: Wide based with mild left leg drag with cane.   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    MRI C-Spine: (Compared with 2015)  1. ?Possible new right-sided cord lesion at C2 with essentially unchanged scattered additional short segment cervical spinal cord signal abnormalities consistent with clinically reported multiple sclerosis. No enhancing lesion is identified.  2. ?Prior C5-C6 ACDF with solid interbody fusion with progression of degenerative changes at C6-C7 resulting in moderate spinal canal stenosis.    LP positive oligoclonal bands, IgG index 1.69, IgG synthesis 11.2          Multiple sclerosis (HCC)  She seems to have stopped having relapses, but is concerned about gradual worsening. Will repeat MRI head   Continue ocrevus  Will start PT for improvement of gait and strengthening of her legs.   I raised her cane up to improve her gait.  CBC, CMP immunogloublins, Lymphocyte subsets.   Will get the MRI in [place] and have her do a telehealth appointment to review results.       Migraine without aura and without status migrainosus, not intractable  Stable on Ubrelvy  Continue Ubrelvy    Migraine with aura and without status migrainosus, not intractable  Stable on Ubrelvy  Continue ubrelvy    I spent approximately 45 minutes on this patient encounter with more than 50% of the time counseling and coordinating care for the patient

## 2019-09-05 LAB — CBC AND DIFF
Lab: 0.1 10*3/uL (ref 0–0.45)
Lab: 0.3 10*3/uL (ref 0–0.80)
Lab: 1 % (ref >60)
Lab: 1.2 10*3/uL (ref 1.0–4.8)
Lab: 12 % (ref 11–15)
Lab: 13 g/dL (ref 12.0–15.0)
Lab: 25 % (ref 24–44)
Lab: 3 % (ref 0–5)
Lab: 3.2 10*3/uL (ref >60)
Lab: 30 pg (ref 26–34)
Lab: 310 10*3/uL (ref 150–400)
Lab: 33 g/dL (ref 32.0–36.0)
Lab: 4.6 M/UL (ref 4.0–5.0)
Lab: 41 % (ref 36–45)
Lab: 5.1 10*3/uL — ABNORMAL LOW (ref 4.5–11.0)
Lab: 64 % (ref 41–77)
Lab: 7 % (ref 4–12)
Lab: 8.4 FL — ABNORMAL HIGH (ref 7–11)
Lab: 89 FL (ref 80–100)

## 2019-09-05 LAB — COMPREHENSIVE METABOLIC PANEL: Lab: 139 MMOL/L (ref 137–147)

## 2019-09-05 LAB — IMMUNOGLOBULINS-IGA,IGG,IGM: Lab: 724 mg/dL — ABNORMAL LOW (ref 762–1488)

## 2019-09-15 ENCOUNTER — Encounter: Admit: 2019-09-15 | Discharge: 2019-09-15

## 2019-09-15 DIAGNOSIS — G35 Multiple sclerosis: Secondary | ICD-10-CM

## 2019-09-16 ENCOUNTER — Encounter: Admit: 2019-09-16 | Discharge: 2019-09-16

## 2019-09-21 ENCOUNTER — Encounter: Admit: 2019-09-21 | Discharge: 2019-09-21

## 2019-09-23 ENCOUNTER — Encounter: Admit: 2019-09-23 | Discharge: 2019-09-23

## 2019-09-25 ENCOUNTER — Encounter: Admit: 2019-09-25 | Discharge: 2019-09-25

## 2019-09-25 ENCOUNTER — Ambulatory Visit: Admit: 2019-09-25 | Discharge: 2019-09-26 | Payer: Commercial Managed Care - PPO

## 2019-09-25 DIAGNOSIS — I050 Rheumatic mitral stenosis: Secondary | ICD-10-CM

## 2019-09-25 DIAGNOSIS — M1990 Unspecified osteoarthritis, unspecified site: Secondary | ICD-10-CM

## 2019-09-25 DIAGNOSIS — U071 COVID-19 virus detected: Secondary | ICD-10-CM

## 2019-09-25 DIAGNOSIS — G35 Multiple sclerosis: Secondary | ICD-10-CM

## 2019-09-25 DIAGNOSIS — H547 Unspecified visual loss: Secondary | ICD-10-CM

## 2019-09-25 DIAGNOSIS — A64 Unspecified sexually transmitted disease: Secondary | ICD-10-CM

## 2019-09-25 DIAGNOSIS — N390 Urinary tract infection, site not specified: Secondary | ICD-10-CM

## 2019-09-25 DIAGNOSIS — G629 Polyneuropathy, unspecified: Secondary | ICD-10-CM

## 2019-09-25 DIAGNOSIS — R519 Generalized headaches: Secondary | ICD-10-CM

## 2019-09-25 MED ORDER — ALPRAZOLAM 0.25 MG PO TAB
.25 mg | ORAL_TABLET | Freq: Every day | ORAL | 0 refills | Status: AC | PRN
Start: 2019-09-25 — End: ?

## 2019-11-04 ENCOUNTER — Encounter: Admit: 2019-11-04 | Discharge: 2019-11-04

## 2019-11-04 NOTE — Telephone Encounter
Patient was advised in detail of Dr. Emilee Hero thought and recommendations regarding testosterone and treatment.

## 2019-11-04 NOTE — Telephone Encounter
Patient left message, is wanting to see if testosterone treatment or treatments would be possible for her, she has heard it works wonders for people with MS men or women. Would like to know Dr. Emilee Hero thoughts on this.

## 2019-12-04 ENCOUNTER — Encounter: Admit: 2019-12-04 | Discharge: 2019-12-04

## 2019-12-04 MED ORDER — UBRELVY 50 MG PO TAB
ORAL_TABLET | Freq: Once | ORAL | 2 refills | 30.00000 days | Status: AC
Start: 2019-12-04 — End: ?

## 2019-12-04 NOTE — Telephone Encounter
Patient calls regarding Monica Burnett, picked up most recent rx 11-08-19 #10 tabs and has taken her last pill today. She feels she has had more migraines lately but does report medication helps and she never has to take a second pill. Wants to make sure it is ok to get refills and see if Dr. Burnadette Peter has any concerns regarding Migraines.

## 2019-12-04 NOTE — Telephone Encounter
Spoke to patient regarding Monica Burnett, advised her Dr. Burnadette Peter is ok with sending refills in for patient, encouraged her that Dr. Burnadette Peter is glad this medications is working for her. Discussed migraine triggers as well. Refills sent

## 2020-01-18 ENCOUNTER — Encounter: Admit: 2020-01-18 | Discharge: 2020-01-18

## 2020-01-27 ENCOUNTER — Encounter: Admit: 2020-01-27 | Discharge: 2020-01-27

## 2020-02-03 ENCOUNTER — Ambulatory Visit: Admit: 2020-02-03 | Discharge: 2020-02-04 | Payer: Commercial Managed Care - PPO

## 2020-02-03 ENCOUNTER — Encounter: Admit: 2020-02-03 | Discharge: 2020-02-03

## 2020-02-03 DIAGNOSIS — H547 Unspecified visual loss: Secondary | ICD-10-CM

## 2020-02-03 DIAGNOSIS — N390 Urinary tract infection, site not specified: Secondary | ICD-10-CM

## 2020-02-03 DIAGNOSIS — A64 Unspecified sexually transmitted disease: Secondary | ICD-10-CM

## 2020-02-03 DIAGNOSIS — G35 Multiple sclerosis: Secondary | ICD-10-CM

## 2020-02-03 DIAGNOSIS — U071 COVID-19 virus detected: Secondary | ICD-10-CM

## 2020-02-03 DIAGNOSIS — I050 Rheumatic mitral stenosis: Secondary | ICD-10-CM

## 2020-02-03 DIAGNOSIS — M1990 Unspecified osteoarthritis, unspecified site: Secondary | ICD-10-CM

## 2020-02-03 DIAGNOSIS — G629 Polyneuropathy, unspecified: Secondary | ICD-10-CM

## 2020-02-03 DIAGNOSIS — R519 Generalized headaches: Secondary | ICD-10-CM

## 2020-02-03 MED ORDER — UBRELVY 50 MG PO TAB
ORAL_TABLET | Freq: Once | ORAL | 2 refills | 30.00000 days | Status: AC
Start: 2020-02-03 — End: ?

## 2020-02-03 MED ORDER — SERTRALINE 50 MG PO TAB
50 mg | ORAL_TABLET | Freq: Every day | ORAL | 5 refills | Status: AC
Start: 2020-02-03 — End: ?

## 2020-02-04 DIAGNOSIS — G35 Multiple sclerosis: Principal | ICD-10-CM

## 2020-02-05 ENCOUNTER — Encounter: Admit: 2020-02-05 | Discharge: 2020-02-05

## 2020-02-05 DIAGNOSIS — I050 Rheumatic mitral stenosis: Secondary | ICD-10-CM

## 2020-02-05 DIAGNOSIS — G35 Multiple sclerosis: Secondary | ICD-10-CM

## 2020-02-05 DIAGNOSIS — R519 Generalized headaches: Secondary | ICD-10-CM

## 2020-02-05 DIAGNOSIS — H547 Unspecified visual loss: Secondary | ICD-10-CM

## 2020-02-05 DIAGNOSIS — A64 Unspecified sexually transmitted disease: Secondary | ICD-10-CM

## 2020-02-05 DIAGNOSIS — G629 Polyneuropathy, unspecified: Secondary | ICD-10-CM

## 2020-02-05 DIAGNOSIS — U071 COVID-19 virus detected: Secondary | ICD-10-CM

## 2020-02-05 DIAGNOSIS — N390 Urinary tract infection, site not specified: Secondary | ICD-10-CM

## 2020-02-05 DIAGNOSIS — M1990 Unspecified osteoarthritis, unspecified site: Secondary | ICD-10-CM

## 2020-03-24 ENCOUNTER — Encounter: Admit: 2020-03-24 | Discharge: 2020-03-24

## 2020-03-24 DIAGNOSIS — G35 Multiple sclerosis: Secondary | ICD-10-CM

## 2020-03-24 DIAGNOSIS — Z79899 Other long term (current) drug therapy: Secondary | ICD-10-CM

## 2020-03-25 ENCOUNTER — Encounter: Admit: 2020-03-25 | Discharge: 2020-03-25

## 2020-03-25 DIAGNOSIS — G35 Multiple sclerosis: Secondary | ICD-10-CM

## 2020-03-25 DIAGNOSIS — Z79899 Other long term (current) drug therapy: Secondary | ICD-10-CM

## 2020-03-29 NOTE — Progress Notes
Date of Service: 03/29/2020    Subjective:             Monica Burnett is a 55 y.o. female.    History of Present Illness  She has had her ocrevus infusions regularly. Her potassium has been decreased after her injections. She also gets a little more off balance Afterward.  Last dose was in December.   She has not been doing her exercises much. She has a lot of fatigue. She takes a couple of short naps a day.  Her left leg remains weak and she requires a cane to walk. Her husband believes that she walks better with a longer stride with the walker, but she has been reluctant to use it.   She is interested in using an elliptical to increase strength and give her more energy.  She has been having more problems with headaches in the past month. She has wondered about sinuses being an issue. Bernita Raisin has been helping her migraines. Takes one about once a month most of the time, but took it 4 times in December. So far, January has been better.          Review of Systems   All other systems reviewed and are negative.        Objective:         ? ALPRAZolam (XANAX) 0.25 mg tablet Take one tablet by mouth daily as needed for Anxiety.   ? cholecalciferol (VITAMIN D-3) 1,000 units tablet Take 1,000 Units by mouth as Needed.   ? cyanocobalamin (vitamin B-12) (VITAMIN B12 PO) Take  by mouth daily.   ? cyclobenzaprine (FLEXERIL) 10 mg tablet Take one tablet by mouth twice daily as needed for Muscle Cramps.   ? ocrelizumab (OCREVUS IV) Administer 600 mg through vein every 180 days.   ? sertraline (ZOLOFT) 50 mg tablet Take one tablet by mouth daily.   ? ubrogepant (UBRELVY) 50 mg tablet May repeat once after 2 hours based on response.     Vitals:    03/29/20 1124   BP: 122/83   BP Source: Arm, Left Upper   Patient Position: Sitting   Pulse: 84   Resp: 14   Weight: 61.3 kg (135 lb 3.2 oz)   Height: 162.6 cm (64)   PainSc: Zero     Body mass index is 23.21 kg/m?Marland Kitchen     Physical Exam  Depression Screening was performed on Monica Burnett in clinic today. Based on the score of 0, no follow up action or recommendations are necessary at this time.  Examination    Mental Status Exam: Patient is alert and oriented in all four spheres. There is normal short and long term memory. Language functions are normal both for comprehension and expression.  Speech and Language: nl  HEENT: Normal.  Extremities:   Cranial Nerve Exam:   Cranial Nerve II Right Left   Visual Acuity 20/20-1 glasses 20/15 glasses   Pupil     Visual Fields     Fundoscopic     Color Vision       Cranial Nerves III-XII Right Left   III, IV, VI (EOM's) INO Saccadic smooth pursuit INO Saccadic smooth pursuit   V nl nl   VII     VIII nl nl   IX, X nl nl   XI nl nl   XII nl nl     Nystagmus: None    Motor:    R L   R L  Neck flexors    Hip flexors 4 4-   Neck extensors    Hip abductors     Shoulder Abductors 5 5  Hip extensors 5 5   Elbow flexors 5 5  Hip adductors     Elbow extensors 5 5  Knee flexors 5 4   Wrist flexors 5 5  Knee extensors 5 5   Wrist extensors 5 5  Ankle dorsiflexors 5 4+   Finger flexors 5 5  Ankle plantar flexors 5 5   Finger abductors 5 5  Ankle inversion      5 5  Ankle eversion         Toe flexors         Toe extensors       Bulk and Tone:    Upper Extremity R L   Atrophy No No   Increased Tone No No   Fasciculations No No     Lower Extremity R L   Atrophy No No   Increased Tone No No   Fasciculations No No       Cerebellar/Fine Motor R L   Finger nose finger Normal Normal   Heel to shin     Finger tapping     Foot tapping     Rapid alternating movements Normal Normal     Gait: mild left leg drag with cane.   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 ?Possible new right-sided cord lesion at C2 with essentially unchanged scattered additional short segment cervical spinal cord signal abnormalities consistent with clinically reported multiple sclerosis. No enhancing lesion is identified.  2. ?Prior C5-C6 ACDF with solid interbody fusion with progression of degenerative changes at C6-C7 resulting in moderate spinal canal stenosis.    LP positive oligoclonal bands, IgG index 1.69, IgG synthesis 11.2          Multiple sclerosis (HCC)  She is clinically stable on ocrevus  Continue Ocrevus  MRI in 6 months.  Encouraged her to exercise.  Encouraged her to get the elliptical      Migraine with aura and without status migrainosus, not intractable  The Bernita Raisin seems to be working for her.   Continue Bernita Raisin

## 2020-03-29 NOTE — Assessment & Plan Note
The Bernita Raisin seems to be working for her.   Continue Bernita Raisin

## 2020-04-26 ENCOUNTER — Encounter: Admit: 2020-04-26 | Discharge: 2020-04-26

## 2020-04-26 NOTE — Telephone Encounter
Patient reports recently having difficulty walking, balance problems, weakness in her legs. She contacted her PCP, and is scheduled tomorrow to have her urine checked for possible infection.   Advised patient to reach out to office once she gets urine results and or plan from PCP. If I have not heard from her by Thursday or Friday I will reach out to her.   Patient agreed with plan

## 2020-06-14 ENCOUNTER — Encounter: Admit: 2020-06-14 | Discharge: 2020-06-14

## 2020-06-14 MED ORDER — UBRELVY 50 MG PO TAB
ORAL_TABLET | ORAL | 2 refills | 30.00000 days | Status: AC
Start: 2020-06-14 — End: ?

## 2020-06-14 NOTE — Telephone Encounter
Patient calls reporting trouble walking/ numbness & weakness on her left side. Symptoms began over the weekend. Reports no recent illness or urinary symptoms at this time.

## 2020-06-14 NOTE — Telephone Encounter
Refill request for ubrevly, last office visit 03-29-20 refills sent

## 2020-06-16 ENCOUNTER — Encounter: Admit: 2020-06-16 | Discharge: 2020-06-16

## 2020-06-16 DIAGNOSIS — R3989 Other symptoms and signs involving the genitourinary system: Secondary | ICD-10-CM

## 2020-06-16 DIAGNOSIS — G35 Multiple sclerosis: Secondary | ICD-10-CM

## 2020-06-16 DIAGNOSIS — R531 Weakness: Secondary | ICD-10-CM

## 2020-06-16 MED ORDER — PREDNISONE 20 MG PO TAB
ORAL_TABLET | 0 refills | Status: AC
Start: 2020-06-16 — End: ?

## 2020-06-20 ENCOUNTER — Encounter: Admit: 2020-06-20 | Discharge: 2020-06-20

## 2020-06-20 DIAGNOSIS — R531 Weakness: Secondary | ICD-10-CM

## 2020-06-20 DIAGNOSIS — R3989 Other symptoms and signs involving the genitourinary system: Secondary | ICD-10-CM

## 2020-06-20 DIAGNOSIS — G35 Multiple sclerosis: Secondary | ICD-10-CM

## 2020-07-26 ENCOUNTER — Encounter: Admit: 2020-07-26 | Discharge: 2020-07-26

## 2020-07-26 DIAGNOSIS — G35 Multiple sclerosis: Secondary | ICD-10-CM

## 2020-07-26 DIAGNOSIS — R3989 Other symptoms and signs involving the genitourinary system: Secondary | ICD-10-CM

## 2020-07-26 NOTE — Progress Notes
Patient believes she has a UTI- orders for UA and culture faxed to [facility] lab at [phone]

## 2020-07-28 ENCOUNTER — Encounter: Admit: 2020-07-28 | Discharge: 2020-07-28

## 2020-07-28 MED ORDER — SULFAMETHOXAZOLE-TRIMETHOPRIM 800-160 MG PO TAB
1 | ORAL_TABLET | Freq: Two times a day (BID) | ORAL | 0 refills | Status: AC
Start: 2020-07-28 — End: ?

## 2020-07-28 NOTE — Telephone Encounter
Dr. Burnadette Peter reviewed urinalysis results from 07-27-20, showing UTI. Culture will result in 1-2 days still. Verbal order from Dr. Burnadette Peter to send in bactrim DS- 1 tab BID x 14 days. Read back and verified.   Patient was contacted regarding results, advised her of recommendations to start bactrim BID x 14 days, informed her once culture results are back will notify her if we need to change antibiotics, other wise finish the bactrim. Patient verbalized understanding.

## 2020-07-29 ENCOUNTER — Encounter: Admit: 2020-07-29 | Discharge: 2020-07-29

## 2020-07-29 DIAGNOSIS — R3989 Other symptoms and signs involving the genitourinary system: Secondary | ICD-10-CM

## 2020-07-29 DIAGNOSIS — G35 Multiple sclerosis: Secondary | ICD-10-CM

## 2020-08-09 ENCOUNTER — Encounter: Admit: 2020-08-09 | Discharge: 2020-08-09

## 2020-08-09 DIAGNOSIS — G35 Multiple sclerosis: Secondary | ICD-10-CM

## 2020-08-09 DIAGNOSIS — R3989 Other symptoms and signs involving the genitourinary system: Secondary | ICD-10-CM

## 2020-08-12 ENCOUNTER — Encounter: Admit: 2020-08-12 | Discharge: 2020-08-12

## 2020-08-12 NOTE — Telephone Encounter
Patient was advised of Dr. Emilee Hero recommendations to try a second dose of Ubrelvy, as well as some benadryl for sinus.

## 2020-08-12 NOTE — Telephone Encounter
Patient c/o migraine that began this am,took 1 dose of Ubrelvy early this am, once getting to work she took Excedrin migraine and 2 aleve with no relief. Patient states having more migraines recently not sure if it is due to weather but ubrevly and Excedrin migraine typically resolves migraine. Asking for recommendations from Dr. Burnadette Peter

## 2020-09-02 ENCOUNTER — Encounter: Admit: 2020-09-02 | Discharge: 2020-09-02

## 2020-09-02 NOTE — Telephone Encounter
Isaac Bliss PharmD with [facility] called stating they have questions regarding the reaction kit.     Hinton Dyer said that our reaction kit orders for Epi has a max dose of 6 doses, but their standard is to repeat x1 and then call 911 if used. They also want to switch the Tylenol and Benadryl to their standard orders which is:   Tylenol 376m-650mg PO at onset of symptoms  Benadryl 539mIV and 1059mf normal saline IV push over 66m3ms tolerated.       Notified DanaHinton DyerrmD that ok to switch to their standard protocol for the reaction kit.

## 2020-09-08 ENCOUNTER — Encounter: Admit: 2020-09-08 | Discharge: 2020-09-08

## 2020-09-08 NOTE — Telephone Encounter
Pt left the following voicemail:    "I'm calling to let you know that I fell on Thursday June 16th at work and I have a boot on and a wrist brace and am taking some hydrocodone but right now I'm at work because I've been released to go back to work even though [employer] wearing a boot and I can't really get around well, but I just wanted to let you know that that happened. I'm trying to catch up on everything so I'm sorry it took so long for me to get with you. If you need anything or have any questions you can call me back on my cell phone."

## 2020-09-08 NOTE — Telephone Encounter
Spoke to [place] - she has a boot on her left foot and is not walking good on it. Pt says this was a workman's comp claim. She has been back at work but is having trouble. She is walking with a cane. [facility] [place] cleared her to go back to work. Pt would like more time off to get better before going back to work. Pt spoke to her HR department and will send [place] FMLA paperwork to complete. Pt will send paperwork to: [email or web address]

## 2020-09-15 ENCOUNTER — Encounter: Admit: 2020-09-15 | Discharge: 2020-09-15

## 2020-09-15 DIAGNOSIS — G35 Multiple sclerosis: Secondary | ICD-10-CM

## 2020-09-15 DIAGNOSIS — Z79899 Other long term (current) drug therapy: Secondary | ICD-10-CM

## 2020-09-23 ENCOUNTER — Encounter: Admit: 2020-09-23 | Discharge: 2020-09-23 | Payer: Commercial Managed Care - PPO

## 2020-09-23 DIAGNOSIS — G43109 Migraine with aura, not intractable, without status migrainosus: Secondary | ICD-10-CM

## 2020-09-23 MED ORDER — UBRELVY 50 MG PO TAB
ORAL_TABLET | 2 refills | Status: CN
Start: 2020-09-23 — End: ?

## 2020-09-23 MED ORDER — CHLORPROMAZINE 50 MG PO TAB
ORAL_TABLET | ORAL | 0 refills | 28.50000 days | Status: AC
Start: 2020-09-23 — End: ?

## 2020-09-23 NOTE — Telephone Encounter
Pt called stating she needs something for her headache. She tried getting Bernita Raisin from the pharmacy but it requires a PA.    Spoke to pt - she woke up with her headache this morning. It is in the front of her head, forehead and eyes. She took an Excedrin Migraine early this morning but says it didn't help at all and it usually works pretty well. She wasn't sure if she should take another one, says her stomach feels yucky but she did have some lunch.   Pt has not had any vomiting but is sick to her stomach. Her vision is very blurry right now and it's hard to focus. She is having some light sensitivity and can't open her eyes because it's too bright.   I told pt that we will send the [facility] to [place] so they can work on the PA urgently, but that I would forward her message to Dr Burnadette Peter for recommendations in the meantime.  Pt has tried Sumatriptan in the past. Pt said everything she's been given for migraines has helped. Pt said she will take anything to relieve her headache. Pt said with one of her meds in the past she had fast heart rate, and I told her that according to her allergy list Gabapentin caused tachycardia, HOWEVER prior documentation states that she had palpitations with BOTH Imitrex and Gabapentin.

## 2020-09-23 NOTE — Telephone Encounter
Spoke with Dr. Burnadette Peter to let her know that the ubrevly pa was submitted urgent, it has not come back, she does not have any one hand.     Dr. Burnadette Peter advised she can try taking thorazine 50 mg now and then one in the morning.    Called patient to her know that the pa has been submitted waiting on a determination, dr Burnadette Peter would like for her to try thorazine and it was sent to [facility]

## 2020-09-25 ENCOUNTER — Encounter: Admit: 2020-09-25 | Discharge: 2020-09-25 | Payer: Commercial Managed Care - PPO

## 2020-09-25 NOTE — Progress Notes
Received approval letter for Monica Burnett. Approval letter sent to [facility] in [place], [place] at fax # [phone]. Received fax confirmation at 9:42am.

## 2020-09-26 ENCOUNTER — Encounter: Admit: 2020-09-26 | Discharge: 2020-09-26 | Payer: Commercial Managed Care - PPO

## 2020-09-26 NOTE — Progress Notes
Contacted Monica Burnett to discuss updating her pharmacy insurance to reflect she no longer is covered through her workman's Comphensation. Once insurance has been updated I will submit a prior authorization for the medication Monica Burnett, CPhT  Pharmacy Patient Advocate, Specialty Pharmacy  [phone]

## 2020-09-28 ENCOUNTER — Encounter: Admit: 2020-09-28 | Discharge: 2020-09-28 | Payer: Commercial Managed Care - PPO

## 2020-09-28 ENCOUNTER — Ambulatory Visit: Admit: 2020-09-28 | Discharge: 2020-09-28 | Payer: Commercial Managed Care - PPO

## 2020-09-28 DIAGNOSIS — N390 Urinary tract infection, site not specified: Secondary | ICD-10-CM

## 2020-09-28 DIAGNOSIS — M1990 Unspecified osteoarthritis, unspecified site: Secondary | ICD-10-CM

## 2020-09-28 DIAGNOSIS — A64 Unspecified sexually transmitted disease: Secondary | ICD-10-CM

## 2020-09-28 DIAGNOSIS — R519 Generalized headaches: Secondary | ICD-10-CM

## 2020-09-28 DIAGNOSIS — G629 Polyneuropathy, unspecified: Secondary | ICD-10-CM

## 2020-09-28 DIAGNOSIS — G35 Multiple sclerosis: Secondary | ICD-10-CM

## 2020-09-28 DIAGNOSIS — U071 COVID-19 virus detected: Secondary | ICD-10-CM

## 2020-09-28 DIAGNOSIS — I050 Rheumatic mitral stenosis: Secondary | ICD-10-CM

## 2020-09-28 DIAGNOSIS — H547 Unspecified visual loss: Secondary | ICD-10-CM

## 2020-09-28 DIAGNOSIS — Z79899 Other long term (current) drug therapy: Secondary | ICD-10-CM

## 2020-09-28 LAB — CBC AND DIFF
ABSOLUTE BASO COUNT: 0 K/UL (ref 0–0.20)
ABSOLUTE EOS COUNT: 0.1 K/UL (ref 0–0.45)
ABSOLUTE LYMPH COUNT: 1.4 K/UL (ref 1.0–4.8)
ABSOLUTE MONO COUNT: 0.4 K/UL (ref 0–0.80)
ABSOLUTE NEUTROPHIL: 3.1 K/UL (ref 1.8–7.0)
BASOPHILS %: 1 % (ref >60)
EOSINOPHILS %: 3 % (ref 0–5)
HEMATOCRIT: 39 % (ref 36–45)
HEMOGLOBIN: 13 g/dL (ref 12.0–15.0)
LYMPHOCYTES %: 28 % — ABNORMAL HIGH (ref 24–44)
MCH: 30 pg (ref 26–34)
MCHC: 33 g/dL (ref 32.0–36.0)
MCV: 91 FL (ref 80–100)
MONOCYTES %: 8 % (ref 4–12)
MPV: 8.2 FL (ref 7–11)
NEUTROPHILS %: 60 % (ref 41–77)
PLATELET COUNT: 282 K/UL (ref 150–400)
RBC COUNT: 4.3 M/UL (ref 4.0–5.0)
RDW: 12 % (ref 11–15)
WBC COUNT: 5.3 K/UL — ABNORMAL LOW (ref 4.5–11.0)

## 2020-09-28 LAB — IMMUNOGLOBULINS-IGA,IGG,IGM: IGG: 603 mg/dL — ABNORMAL LOW (ref 762–1488)

## 2020-09-28 LAB — COMPREHENSIVE METABOLIC PANEL: SODIUM: 143 MMOL/L (ref 137–147)

## 2020-09-29 ENCOUNTER — Encounter: Admit: 2020-09-29 | Discharge: 2020-09-29 | Payer: Commercial Managed Care - PPO

## 2020-09-29 DIAGNOSIS — R519 Generalized headaches: Secondary | ICD-10-CM

## 2020-09-29 DIAGNOSIS — G35 Multiple sclerosis: Secondary | ICD-10-CM

## 2020-09-29 DIAGNOSIS — N390 Urinary tract infection, site not specified: Secondary | ICD-10-CM

## 2020-09-29 DIAGNOSIS — I050 Rheumatic mitral stenosis: Secondary | ICD-10-CM

## 2020-09-29 DIAGNOSIS — M1990 Unspecified osteoarthritis, unspecified site: Secondary | ICD-10-CM

## 2020-09-29 DIAGNOSIS — A64 Unspecified sexually transmitted disease: Secondary | ICD-10-CM

## 2020-09-29 DIAGNOSIS — G629 Polyneuropathy, unspecified: Secondary | ICD-10-CM

## 2020-09-29 DIAGNOSIS — U071 COVID-19 virus detected: Secondary | ICD-10-CM

## 2020-09-29 DIAGNOSIS — H547 Unspecified visual loss: Secondary | ICD-10-CM

## 2020-10-04 ENCOUNTER — Encounter: Admit: 2020-10-04 | Discharge: 2020-10-04 | Payer: Commercial Managed Care - PPO

## 2020-10-05 ENCOUNTER — Encounter: Admit: 2020-10-05 | Discharge: 2020-10-05 | Payer: Commercial Managed Care - PPO

## 2020-10-14 ENCOUNTER — Encounter: Admit: 2020-10-14 | Discharge: 2020-10-14 | Payer: Commercial Managed Care - PPO

## 2020-10-26 ENCOUNTER — Encounter: Admit: 2020-10-26 | Discharge: 2020-10-26 | Payer: Commercial Managed Care - PPO

## 2020-10-26 NOTE — Telephone Encounter
Monica Burnett with [facility] is calling stating that patients insurance is changing, they are wanting past medication history and orders regarding her ocrevus infusion. Would like at a call back at [phone].     Sent message to patient to confirm insurance is changing.

## 2020-10-31 ENCOUNTER — Encounter: Admit: 2020-10-31 | Discharge: 2020-10-31 | Payer: Commercial Managed Care - PPO

## 2020-10-31 DIAGNOSIS — G35 Multiple sclerosis: Secondary | ICD-10-CM

## 2020-11-07 ENCOUNTER — Encounter: Admit: 2020-11-07 | Discharge: 2020-11-07 | Payer: Commercial Managed Care - PPO

## 2020-11-07 NOTE — Telephone Encounter
I called Monica Burnett - she said she is all better. All of her symptoms have resolved and she feels fine. She checked her temp at 9am and it was 98.6. Told pt to keep [place] posted with any changes.

## 2020-11-07 NOTE — Telephone Encounter
Pt called stating she is having trouble seeing, her eyes are jumping around so bad she can hardly focus on anything. Her right shoulder is so cold and she can't warm it up, she's wearing a jacket and has been under blankets, it doesn't feel cold but just feels cold to her.     Spoke to [place] - she said this started early this morning. She couldn't sleep last night, she couldn't get warm and is cold. She isn't sure if she has a fever but she's been cold, she does not have a way to check her temperature.   No recent UTI symptoms. She has had a little bit of a cough lately, had a cold a couple days ago and took OTC medications.   No muscle weakness or trouble walking. No pain.  She does have dizziness.   If she tries to read she can't because her eyes are jumping. Pt said she has had this before with her flare ups.

## 2020-11-10 ENCOUNTER — Encounter: Admit: 2020-11-10 | Discharge: 2020-11-10 | Payer: Commercial Managed Care - PPO

## 2020-11-10 NOTE — Telephone Encounter
Have received multiple calls from [facility] stating that Karl's insurance plan has changed, they had notified her that her infusion will be changing to [facility] and they needed orders to be sent to  So she doesn't miss an infusion.     Received call now on 11-09-20 from [facility] that with the patients insurance a prior authorization is now required and needed to be done through the patients health plan. Will call patients insurance to confirm if she has to use [facility] or not.

## 2020-11-15 ENCOUNTER — Encounter: Admit: 2020-11-15 | Discharge: 2020-11-15 | Payer: Commercial Managed Care - PPO

## 2020-11-15 DIAGNOSIS — R300 Dysuria: Secondary | ICD-10-CM

## 2020-11-15 NOTE — Telephone Encounter
Notified pt that we have ordered a UA. Pt would like to get this done at [facility] lab and she will go on her lunch break today.  Orders faxed to [phone]. Received fax confirmation at 10:39am.

## 2020-11-15 NOTE — Telephone Encounter
Pt called stating she is pretty sure she has a UTI. She had problems last night, was having burning and couldn't empty her bladder, could just go a tiny bit then a few minutes later went a tiny bit and couldn't empty her bladder. Having to go frequently. She called her PCP this morning but they weren't there so she thought she'd leave a message with [place].

## 2020-11-17 ENCOUNTER — Encounter: Admit: 2020-11-17 | Discharge: 2020-11-17 | Payer: Commercial Managed Care - PPO

## 2020-11-17 DIAGNOSIS — R300 Dysuria: Secondary | ICD-10-CM

## 2020-11-30 ENCOUNTER — Encounter: Admit: 2020-11-30 | Discharge: 2020-11-30 | Payer: Commercial Managed Care - PPO

## 2020-11-30 DIAGNOSIS — G43109 Migraine with aura, not intractable, without status migrainosus: Secondary | ICD-10-CM

## 2020-11-30 MED ORDER — UBRELVY 50 MG PO TAB
ORAL_TABLET | ORAL | 5 refills | 30.00000 days | Status: AC
Start: 2020-11-30 — End: ?

## 2020-11-30 NOTE — Telephone Encounter
Patient calls for refill of Ubrelvy, refills sent

## 2020-12-01 ENCOUNTER — Encounter: Admit: 2020-12-01 | Discharge: 2020-12-01 | Payer: Commercial Managed Care - PPO

## 2020-12-01 NOTE — Telephone Encounter
Patient calls to report she is having difficulty walking, having extreme weakness in legs, mostly in left leg, right leg is not strong enough to compensate and help the left. Urine was checked 11-16-20, is currently not having any urinary symptoms. Will forward to Dr. Burnadette Peter for recommendations

## 2021-02-13 ENCOUNTER — Encounter: Admit: 2021-02-13 | Discharge: 2021-02-13 | Payer: Commercial Managed Care - PPO

## 2021-02-13 NOTE — Telephone Encounter
Patient having eyes jumping around, cant focus on anything, cant read, cant see straight, cold and hot all at the same time. Was having vision changes. Like she was in an earthquake all weekend, ears were ringing.     Eyes jumping around started this morning. States that she had a migraine all weekend. Went and got [place] and took one and since then all her symptoms have subsided.     Advised patient to let clinic know if the symptoms come on abruptly again or if they start getting worse.

## 2021-02-17 ENCOUNTER — Encounter: Admit: 2021-02-17 | Discharge: 2021-02-17 | Payer: Commercial Managed Care - PPO

## 2021-02-21 ENCOUNTER — Encounter: Admit: 2021-02-21 | Discharge: 2021-02-21

## 2021-02-21 ENCOUNTER — Ambulatory Visit: Admit: 2021-02-21 | Discharge: 2021-02-22

## 2021-02-21 DIAGNOSIS — H547 Unspecified visual loss: Secondary | ICD-10-CM

## 2021-02-21 DIAGNOSIS — A64 Unspecified sexually transmitted disease: Secondary | ICD-10-CM

## 2021-02-21 DIAGNOSIS — G35 Multiple sclerosis: Secondary | ICD-10-CM

## 2021-02-21 DIAGNOSIS — G43109 Migraine with aura, not intractable, without status migrainosus: Secondary | ICD-10-CM

## 2021-02-21 DIAGNOSIS — N390 Urinary tract infection, site not specified: Secondary | ICD-10-CM

## 2021-02-21 DIAGNOSIS — U071 COVID-19 virus detected: Secondary | ICD-10-CM

## 2021-02-21 DIAGNOSIS — M1990 Unspecified osteoarthritis, unspecified site: Secondary | ICD-10-CM

## 2021-02-21 DIAGNOSIS — R519 Generalized headaches: Secondary | ICD-10-CM

## 2021-02-21 DIAGNOSIS — G629 Polyneuropathy, unspecified: Secondary | ICD-10-CM

## 2021-02-21 DIAGNOSIS — I050 Rheumatic mitral stenosis: Secondary | ICD-10-CM

## 2021-02-21 MED ORDER — PROPRANOLOL 60 MG PO CS24
60 mg | ORAL_CAPSULE | Freq: Every day | ORAL | 5 refills | Status: AC
Start: 2021-02-21 — End: ?

## 2021-02-21 NOTE — Progress Notes
Telehealth Visit Note    Date of Service: 02/21/2021    Subjective:      Obtained patient's verbal consent to treat them and their agreement to [facility] financial policy and NPP via this telehealth visit during the [facility]       Monica Burnett is a 55 y.o. female.    History of Present Illness  She has been having some really bad headaches over the past couple of weeks. These a re associated with vision changes and dizziness off and on. The headache does respond to the [place], but the next morning it will be back again. She has not changed medications recently. She is wondering if she can do something to settle the headaches down.   She is otherwise doing well and tolerating Ocrevus faithfully. Last dose was in July. Last MRI was in July 2022        Review of Systems   Musculoskeletal: Positive for gait problem.   Neurological: Positive for headaches.   All other systems reviewed and are negative.        Objective:         ? ALPRAZolam (XANAX) 0.25 mg tablet Take one tablet by mouth daily as needed for Anxiety.   ? cholecalciferol (VITAMIN D-3) 1,000 units tablet Take 1,000 Units by mouth as Needed.   ? cyanocobalamin (vitamin B-12) (VITAMIN B12 PO) Take  by mouth daily.   ? cyclobenzaprine (FLEXERIL) 10 mg tablet Take one tablet by mouth twice daily as needed for Muscle Cramps.   ? ocrelizumab (OCREVUS IV) Administer 600 mg through vein every 180 days.   ? propranolol LA (INDERAL LA) 60 mg capsule Take one capsule by mouth daily.   ? sertraline (ZOLOFT) 50 mg tablet Take one tablet by mouth daily.   ? ubrogepant (UBRELVY) 50 mg tablet TAKE 1 TABLET BY MOUTH AT ONSET OF HEADACHE . MAY REPEAT IN TWO HOURS AS NEEDED NO MORE THAN 2 TABLETS PER 24 HOURS          Telehealth Patient Reported Vitals     Row Name 02/21/21 1028                Weight: 65.8 kg (145 lb)        Height: 162.6 cm (5' 4)        Pain Score: Two        Pain Location: HEAD                  Telehealth Body Mass Index: 24.89 at 02/21/2021  4:08 PM    Physical Exam  Patient is alert and oriented with normal memory and thought process. Normal speech and language.          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  No new lesions are identified      MRI C-Spine: (Compared with 2015)  1. ?Possible new right-sided cord lesion at C2 with essentially unchanged scattered additional short segment cervical spinal cord signal abnormalities consistent with clinically reported multiple sclerosis. No enhancing lesion is identified.  2. ?Prior C5-C6 ACDF with solid interbody fusion with progression of degenerative changes at C6-C7 resulting in moderate spinal canal stenosis.    LP positive oligoclonal bands, IgG index 1.69, IgG synthesis 11.2         Multiple sclerosis (HCC)  She is clinically stable on Ocrevus.   Will continue ocrevus- Next dose due in January.   Continue Lymphocyte subsets and Immunoglobulin levels prior to each dose.   Repeat MRI in 6 months.    Migraine with aura and without status migrainosus, not intractable  Headaches have worsened lately. I don't have a good explanation, but I don't think this represents an ominous sign for her MS.   I would like to try Inderal LA 60 mg daily to try and reduce the frequency of the migraines.   She has a history of low blood pressure, so we will start a low dose. She was cautioned about being careful of being orthostatic.   Continue ubrelvy prn                 30 minutes spent on this patient's encounter with counseling and coordination of care taking >50% of the visit.

## 2021-02-23 ENCOUNTER — Encounter: Admit: 2021-02-23 | Discharge: 2021-02-23 | Payer: Commercial Managed Care - PPO

## 2021-02-23 MED ORDER — CYCLOBENZAPRINE 10 MG PO TAB
10 mg | ORAL_TABLET | Freq: Two times a day (BID) | ORAL | 1 refills | PRN
Start: 2021-02-23 — End: ?

## 2021-02-24 ENCOUNTER — Encounter: Admit: 2021-02-24 | Discharge: 2021-02-24 | Payer: Commercial Managed Care - PPO

## 2021-02-24 DIAGNOSIS — G35 Multiple sclerosis: Secondary | ICD-10-CM

## 2021-03-02 ENCOUNTER — Encounter: Admit: 2021-03-02 | Discharge: 2021-03-02 | Payer: Commercial Managed Care - PPO

## 2021-03-02 MED ORDER — SERTRALINE 50 MG PO TAB
ORAL_TABLET | Freq: Every day | 4 refills
Start: 2021-03-02 — End: ?

## 2021-05-03 ENCOUNTER — Encounter: Admit: 2021-05-03 | Discharge: 2021-05-03 | Payer: Commercial Managed Care - PPO

## 2021-05-09 ENCOUNTER — Encounter: Admit: 2021-05-09 | Discharge: 2021-05-09 | Payer: Commercial Managed Care - PPO

## 2021-05-09 NOTE — Telephone Encounter
Patient calls back and leaves a message that she has had a cough for about a month or so, slight production not sure what it is possibly allergies.

## 2021-05-09 NOTE — Telephone Encounter
Patient reports having leg weakness that began Saturday morning, thought symptoms were better this morning but once she got to work she will still having the continues weakness. Is struggling to walk from her car to the desk which is only about 100 yards. Symptoms are making her extremely tired as well. Reports no recent illness and no urinary symptoms at this time.

## 2021-05-11 ENCOUNTER — Encounter: Admit: 2021-05-11 | Discharge: 2021-05-11 | Payer: Commercial Managed Care - PPO

## 2021-05-23 ENCOUNTER — Encounter: Admit: 2021-05-23 | Discharge: 2021-05-23 | Payer: Commercial Managed Care - PPO

## 2021-05-23 MED ORDER — PREDNISONE 20 MG PO TAB
ORAL_TABLET | 0 refills | Status: AC
Start: 2021-05-23 — End: ?

## 2021-05-23 NOTE — Telephone Encounter
Patient reports sill having bilateral leg weakness ( see call 05-09-20) symptoms have gotten slightly worse but not noticeable to anyone else she believes. She is relying on her cane more, and is able to walk, but is just slow. Feels this weakness is just lingering. Urine results are back and look to be negative.

## 2021-05-23 NOTE — Telephone Encounter
Patient was advised Dr. Burnadette Peter is recommending prednisone 20mg  oral taper.   4 tabs(80mg )by mouth x 10 days,then 3 tabs(60mg )by mouth x3 days,then 2 tabs(40mg )by mouth x 3 days,then1 tab(20mg )by mouth x 3 days  Educated patient on directions, patient verbalized understanding rx sent

## 2021-06-01 ENCOUNTER — Encounter: Admit: 2021-06-01 | Discharge: 2021-06-01 | Payer: Commercial Managed Care - PPO

## 2021-06-27 ENCOUNTER — Encounter: Admit: 2021-06-27 | Discharge: 2021-06-27 | Payer: Commercial Managed Care - PPO

## 2021-06-27 DIAGNOSIS — Z79899 Other long term (current) drug therapy: Secondary | ICD-10-CM

## 2021-06-27 DIAGNOSIS — G35 Multiple sclerosis: Secondary | ICD-10-CM

## 2021-06-27 NOTE — Progress Notes
Patient does not believe labs have been done with last infusion or recently, standing orders for Ocrevus faxed to [place] [facility] at [phone]

## 2021-06-28 ENCOUNTER — Encounter: Admit: 2021-06-28 | Discharge: 2021-06-28 | Payer: Commercial Managed Care - PPO

## 2021-06-28 DIAGNOSIS — G35 Multiple sclerosis: Secondary | ICD-10-CM

## 2021-06-28 DIAGNOSIS — Z79899 Other long term (current) drug therapy: Secondary | ICD-10-CM

## 2021-06-29 ENCOUNTER — Encounter: Admit: 2021-06-29 | Discharge: 2021-06-29 | Payer: Commercial Managed Care - PPO

## 2021-06-29 DIAGNOSIS — Z79899 Other long term (current) drug therapy: Secondary | ICD-10-CM

## 2021-06-29 DIAGNOSIS — G35 Multiple sclerosis: Secondary | ICD-10-CM

## 2021-06-29 NOTE — Telephone Encounter
Patient was advised of lab results showing mildly low potassium, Educated her that this can sometimes make someone feel weak, if she is feeling weak then she should reach out to PCP Dr. Jason Nest to discuss possible potassium replacement. Patient asked for results to be faxed to Dr. Jason Nest. Faxed to [phone]

## 2021-06-29 NOTE — Telephone Encounter
-----   Message from Lynnell Jude, MD sent at 06/28/2021  1:52 PM CDT -----  She has a mildly low potassium. This can sometimes make a person feel weak. If she is feeling a little weak, she should get with her PCP for possible potassium replacement.

## 2021-08-08 ENCOUNTER — Encounter: Admit: 2021-08-08 | Discharge: 2021-08-08 | Payer: Commercial Managed Care - PPO

## 2021-08-10 ENCOUNTER — Encounter: Admit: 2021-08-10 | Discharge: 2021-08-10 | Payer: Commercial Managed Care - PPO

## 2021-08-10 NOTE — Progress Notes
Ocrevus Infusion orders every 6 months from [facility] completed and signed by Dr. Burnadette Peter and faxed back to [phone] Ph: [phone]

## 2021-08-11 ENCOUNTER — Encounter: Admit: 2021-08-11 | Discharge: 2021-08-11 | Payer: Commercial Managed Care - PPO

## 2021-08-11 DIAGNOSIS — G43109 Migraine with aura, not intractable, without status migrainosus: Secondary | ICD-10-CM

## 2021-08-11 MED ORDER — UBRELVY 50 MG PO TAB
ORAL_TABLET | ORAL | 5 refills | 30.00000 days | Status: AC
Start: 2021-08-11 — End: ?

## 2021-08-11 NOTE — Telephone Encounter
Advised patient that Dr Burnadette Peter would like for her to watch the foot drop  since it is getting better and let us know if it dosnt get better or things get worse next week, patient verbalized understanding.

## 2021-08-11 NOTE — Telephone Encounter
Patient calls stated yesterday she woke up with left foot not waning to come off the floor all the time when she is walking, reports it is dragging when walking, especially worse when going up stairs. Patient stated that its like her toes want to catch and trip her self, she is working hard to pick up her foot, and compensate. This is something new for her. She did have a migraine yesterday that has now resolved. Today feels like the foot drop might be slightly better but wanted to still let Dr. Burnadette Peter know and see what her thoughts are

## 2021-08-28 NOTE — Patient Instructions
Thank you for choosing to let us care for you today.  Please read through the following information that will help us continue to provide the best care possible.     Communication:   Preferred method of communication is through MyChart message if the issue cannot wait  until your next scheduled follow up.    My Chart may be used for non-emergent communication.    Messages are not reviewed after hours or over the weekend/holidays/after 3PM. Staff will reply to your email within 48 business hours.    Results: For [place] lab/imaging results, due to the CARES act the results automatically release to MyChart.  For Lab results- Dr. Lynch will address lab values, if there is any concern in your labs values the clinic will call you regarding those results. If your labs were done outside of the [facility], please send a message ensuring we have received those results.  Please allow up to 72 hours for review and response to your results if needed.     Emergency:   If you are having acute (new/sudden onset) or severe/worsening neurologic symptoms, please call 911 or seek care in the ED   After hours emergency: [phone], ask to speak with on-call neurologist    Helpful contact information:   For any imaging ordered on this visit please contact: [place] [facility] scheduling: please call [phone] at your convenience to schedule your studies.   Referrals placed during the visit: if you have not heard from scheduling within one week, please call the call center at [phone] to get scheduling assistance.   Refills on medications, please first contact your pharmacy, who will fax a refill authorization request form to our office.  Weekdays only. Allow up to 2 business days for refills. Please plan ahead, as refills will not be filled after hours.   Scheduling staff may be reached at [phone] option 1 for scheduling needs.    Urgent nursing needs: Mahaley Schwering LPN may be contacted at [phone]. Staff will return your call within 24 business hours.       For Appointments:    To allow us to better care for you and stay on time, we appreciate you arriving 15 minutes prior to your scheduled appointment time.    If you are late to your appointment, we reserve the right to ask you to reschedule or wait until next available time to be seen in fairness to other patients scheduled that day.    There are times when we are running behind in clinic. Our goal is to always be on time, however, there are time when unexpected events occur with patients, which may cause a delay. We appreciate your understanding when this occurs.    If you have an MRI scheduled, please reach out to the clinic nurse ([phone]) or scheduling  ([phone]) ASAP to let them know the date, Dr. Lynch expects to see you in person or by telehealth for an appointment to go over those results with you.    [facility]  [facility]  Phone: [phone]  Fax: [phone]

## 2021-08-30 ENCOUNTER — Encounter: Admit: 2021-08-30 | Discharge: 2021-08-30 | Payer: Commercial Managed Care - PPO

## 2021-09-01 ENCOUNTER — Encounter: Admit: 2021-09-01 | Discharge: 2021-09-01 | Payer: Commercial Managed Care - PPO

## 2021-09-01 ENCOUNTER — Ambulatory Visit: Admit: 2021-09-01 | Discharge: 2021-09-01 | Payer: Commercial Managed Care - PPO

## 2021-09-01 DIAGNOSIS — R519 Generalized headaches: Secondary | ICD-10-CM

## 2021-09-01 DIAGNOSIS — M1990 Unspecified osteoarthritis, unspecified site: Secondary | ICD-10-CM

## 2021-09-01 DIAGNOSIS — N390 Urinary tract infection, site not specified: Secondary | ICD-10-CM

## 2021-09-01 DIAGNOSIS — U071 COVID-19 virus detected: Secondary | ICD-10-CM

## 2021-09-01 DIAGNOSIS — G35 Multiple sclerosis: Secondary | ICD-10-CM

## 2021-09-01 DIAGNOSIS — H547 Unspecified visual loss: Secondary | ICD-10-CM

## 2021-09-01 DIAGNOSIS — A64 Unspecified sexually transmitted disease: Secondary | ICD-10-CM

## 2021-09-01 DIAGNOSIS — I050 Rheumatic mitral stenosis: Secondary | ICD-10-CM

## 2021-09-01 DIAGNOSIS — G629 Polyneuropathy, unspecified: Secondary | ICD-10-CM

## 2021-09-01 NOTE — Progress Notes
Date of Service: 09/01/2021    Subjective:             Monica Burnett is a 56 y.o. female.    History of Present Illness  She has been having some problems with memory and forgetfulness. More trouble doing her job because of this. Her husband and co-workers have been concerned. Otherwise no changes.  Here to review MRI head  Takes ocrevus faithfully and reports no problems with the medication.       Review of Systems   Constitutional: Positive for activity change, appetite change and chills.   HENT: Positive for ear pain, postnasal drip, rhinorrhea and tinnitus.    Eyes: Negative.    Respiratory: Positive for cough.    Cardiovascular: Negative.    Gastrointestinal: Negative.    Endocrine: Negative.    Genitourinary: Positive for dysuria and urgency.   Musculoskeletal: Positive for gait problem and neck stiffness.   Skin: Negative.    Allergic/Immunologic: Positive for environmental allergies and immunocompromised state.   Neurological: Positive for speech difficulty, weakness, numbness and headaches.   Hematological: Bruises/bleeds easily.   Psychiatric/Behavioral: Negative.          Objective:         ? ALPRAZolam (XANAX) 0.25 mg tablet Take one tablet by mouth daily as needed for Anxiety.   ? cholecalciferol (VITAMIN D-3) 1,000 units tablet Take one tablet by mouth as Needed.   ? cyanocobalamin (vitamin B-12) (VITAMIN B12 PO) Take  by mouth daily.   ? cyclobenzaprine (FLEXERIL) 10 mg tablet TAKE ONE TABLET BY MOUTH TWICE DAILY AS NEEDED FOR MUSCLE CRAMPS.   ? ocrelizumab (OCREVUS IV) Administer 600 mg through vein every 180 days.   ? propranolol LA (INDERAL LA) 60 mg capsule Take one capsule by mouth daily.   ? sertraline (ZOLOFT) 50 mg tablet TAKE 1 TABLET BY MOUTH DAILY   ? ubrogepant (UBRELVY) 50 mg tablet TAKE 1 TABLET BY MOUTH AT ONSET OF HEADACHE . MAY REPEAT IN TWO HOURS AS NEEDED NO MORE THAN 2 TABLETS PER 24 HOURS     Vitals:    09/01/21 1322   BP: 117/83   Pulse: 94   PainSc: Zero   Weight: 66 kg (145 lb 9.6 oz)   Height: 162.6 cm (5' 4)     Body mass index is 24.99 kg/m?Marland Kitchen     Physical Exam  Examination    Mental Status Exam: Patient is alert and oriented in all four spheres. There is normal short and long term memory. Language functions are normal both for comprehension and expression.  Speech and Language: nl  HEENT: Normal.  Cranial Nerve Exam:   Cranial Nerve II Right Left   Visual Acuity 20/30 with glasses 20/25 with glasses   Pupil     Visual Fields     Fundoscopic     Color Vision       Cranial Nerves III-XII Right Left   III, IV, VI (EOM's) INO INO   V nl nl   VII nl nl   VIII nl nl   IX, X nl nl   XI nl nl   XII nl nl     Nystagmus: None    Motor:    R L   R L   Neck flexors    Hip flexors 4 4   Neck extensors    Hip abductors     Shoulder Abductors 5 4+  Hip extensors 5 5   Elbow flexors 5  5  Hip adductors     Elbow extensors 5 5  Knee flexors 5 5   Wrist flexors 5 5  Knee extensors 5 5   Wrist extensors 5 5  Ankle dorsiflexors 5 4=   Finger flexors 5 5  Ankle plantar flexors 5 5   Finger abductors 5 4  Ankle inversion      5 4  Ankle eversion         Toe flexors 5 5       Toe extensors 5 5     Bulk and Tone:    Upper Extremity R L   Atrophy No No   Increased Tone No No   Fasciculations No No     Lower Extremity R L   Atrophy No No   Increased Tone No No   Fasciculations No No       Cerebellar/Fine Motor R L   Finger nose finger Normal Mild dysmetria   Heel to shin     Finger tapping     Foot tapping     Rapid alternating movements Normal Normal     Gait: Wide based ataxic  Ambulation Index: 9.87 with cane  9 hole peg  Romberg:     RH:24.87  LH 30.22  SDMT 51         Assessment and Plan:    Problem   Multiple Sclerosis (Hcc)    Multiple Sclerosis Subtype: Relapsing remitting, 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 Mild cerebral volume loss.   2. Extensive white matter signal abnormality with periventricular and deep white matter predilection.   3. Appearance compatible with chronic relapsing remitting multiple   sclerosis.   4. No change from August 20, 2016     2-022 MRI C-Spine: (Compared with 2015)  1. ?Possible new right-sided cord lesion at C2 with essentially unchanged scattered additional short segment cervical spinal cord signal abnormalities consistent with clinically reported multiple sclerosis. No enhancing lesion is identified.  2. ?Prior C5-C6 ACDF with solid interbody fusion with progression of degenerative changes at C6-C7 resulting in moderate spinal canal stenosis.    LP positive oligoclonal bands, IgG index 1.69, IgG synthesis 11.2          Multiple sclerosis (HCC)  She is fairly stable, although she does appear to be having some cognitive worsening.   MRI shows no signs of inflammation or new lesions- reviewed with the patient and her husband.  Continue Ocrevus  Continue Lymphocyte subset and immunoglobulin levels prior to each dose.

## 2021-09-02 NOTE — Assessment & Plan Note
She is fairly stable, although she does appear to be having some cognitive worsening.   MRI shows no signs of inflammation or new lesions- reviewed with the patient and her husband.  Continue Ocrevus  Continue Lymphocyte subset and immunoglobulin levels prior to each dose.

## 2021-09-08 ENCOUNTER — Encounter: Admit: 2021-09-08 | Discharge: 2021-09-08 | Payer: Commercial Managed Care - PPO

## 2021-09-08 NOTE — Telephone Encounter
Patient next Ocrevus infusion is scheduled for 09-22-21, she received a letter from infusion center stating to make sure she does not have any infection before she infuses and if she does to let them know. Patient stated her PCP did give her a standing rx for Macrobid for UTI- and if she feels she has one that she can fill it at her pharmacy and start antibiotic.   Advised patient that she should contact PCP Monday to have her urine checked and culture to check for possible UTI since infusion is coming up, that way if she does have UTI she can get started on antibiotic. Patient reports she is not having any symptoms at this time, but often times she does not have any.   She will reach out to PCP Monday to have urine tested.

## 2021-09-26 ENCOUNTER — Encounter: Admit: 2021-09-26 | Discharge: 2021-09-26 | Payer: Commercial Managed Care - PPO

## 2021-10-09 ENCOUNTER — Encounter: Admit: 2021-10-09 | Discharge: 2021-10-09 | Payer: Commercial Managed Care - PPO

## 2021-10-09 MED ORDER — TRAMADOL 50 MG PO TAB
50 mg | ORAL_TABLET | Freq: Every day | ORAL | 0 refills | Status: AC | PRN
Start: 2021-10-09 — End: ?

## 2021-10-09 NOTE — Telephone Encounter
Patient was advised , rx pended to Dr.Lynch to approve and send

## 2021-10-09 NOTE — Telephone Encounter
She is gong to be following up with her neck surgeon, she believes the pain from her neck is causing her eyes to jump around, so lately she has been taking 1 daily of the 50mg 

## 2021-10-09 NOTE — Telephone Encounter
Patient has a old tramadol rx 50mg  from 2018 that she took to help with this occular pain that she has been having that has been causing her eyes to jump around. Reports that when she takes it her symptoms resolve, Is asking if Dr. 2019 would be willing to refill this for her?

## 2021-10-24 ENCOUNTER — Encounter: Admit: 2021-10-24 | Discharge: 2021-10-24 | Payer: Commercial Managed Care - PPO

## 2021-11-13 ENCOUNTER — Encounter: Admit: 2021-11-13 | Discharge: 2021-11-13 | Payer: Commercial Managed Care - PPO

## 2021-11-23 ENCOUNTER — Encounter: Admit: 2021-11-23 | Discharge: 2021-11-23 | Payer: Commercial Managed Care - PPO

## 2021-11-23 MED ORDER — TRAMADOL 50 MG PO TAB
50 mg | ORAL_TABLET | Freq: Every day | ORAL | 0 refills | Status: AC | PRN
Start: 2021-11-23 — End: ?

## 2021-11-23 NOTE — Telephone Encounter
Patient calls for refill of tramadol that she takes PRN, she is scheduled to see the neurosurgeon 12-12-21 and is asking for a refill until seen. Refill pended to Dr. Burnadette Peter to approve and send

## 2021-11-29 ENCOUNTER — Encounter: Admit: 2021-11-29 | Discharge: 2021-11-29 | Payer: Commercial Managed Care - PPO

## 2021-11-29 NOTE — Telephone Encounter
Spoke to Dr. Burnadette Peter who advised to have patient rest, not over do it and check in with [place] Friday with how symptoms are doing.   Advised patient of recommendations, also to check in with PCP to check and make sure she does not have sinus infection, have her ears looked and and she is also going to have her urine checked. Patient reports when laying down her numbness when up to her saddle area. She will see PCP and report back on symptoms Friday morning, and continue to rest and not over do it.

## 2021-11-29 NOTE — Telephone Encounter
Patient calls reporting this morning new symptoms in her right leg never had before at her mid shin down to her foot was numb , also having numbness at the tip of her tongue. Patient has also reported ringing in her right ear that comes and goes - gets louder then quiet. Has had some sinus / allergy symptoms on and off the past 1-2 weeks. Advised patient will discuss symptoms with Dr. Burnadette Peter and call her back with recommendations.

## 2022-01-01 ENCOUNTER — Encounter: Admit: 2022-01-01 | Discharge: 2022-01-01 | Payer: Commercial Managed Care - PPO

## 2022-01-04 ENCOUNTER — Encounter: Admit: 2022-01-04 | Discharge: 2022-01-04 | Payer: Commercial Managed Care - PPO

## 2022-01-04 MED ORDER — TRAMADOL 50 MG PO TAB
50 mg | ORAL_TABLET | Freq: Every day | ORAL | 2 refills | Status: AC | PRN
Start: 2022-01-04 — End: ?

## 2022-01-04 NOTE — Telephone Encounter
Patient calls for refill of Tramadol did see Dr. Awanda Mink - neurosurgery fur her neck, and her neck is stable, is asking if Dr. Donnal Debar will continue to fill tramadol for her to take for pain.

## 2022-02-27 ENCOUNTER — Encounter: Admit: 2022-02-27 | Discharge: 2022-02-27 | Payer: Commercial Managed Care - PPO

## 2022-02-27 NOTE — Telephone Encounter
Patient calls with symptoms that began this morning. Numbness in left leg from mid thigh down, making her unsteady and difficult to walk at times. Also reports having bowel/ bladder incontinence earlier as well. Patient recently was treated for UTI about 2 weeks ago, is not having any urinary symptoms at this time. Has not been around anyone that has been ill as well.

## 2022-03-01 ENCOUNTER — Encounter: Admit: 2022-03-01 | Discharge: 2022-03-01 | Payer: Commercial Managed Care - PPO

## 2022-03-01 DIAGNOSIS — R3989 Other symptoms and signs involving the genitourinary system: Secondary | ICD-10-CM

## 2022-03-01 DIAGNOSIS — G35 Multiple sclerosis: Secondary | ICD-10-CM

## 2022-03-01 DIAGNOSIS — R3915 Urgency of urination: Secondary | ICD-10-CM

## 2022-03-02 NOTE — Patient Instructions
Thank you for choosing to let us care for you today.  Please read through the following information that will help us continue to provide the best care possible.     Communication:   Preferred method of communication is through MyChart message if the issue cannot wait  until your next scheduled follow up.    My Chart may be used for non-emergent communication.    Messages are not reviewed after hours or over the weekend/holidays/after 3PM. Staff will reply to your email within 48 business hours.    Results: For [place] lab/imaging results, due to the CARES act the results automatically release to MyChart.  For Lab results- Dr. Lynch will address lab values, if there is any concern in your labs values the clinic will call you regarding those results. If your labs were done outside of the [facility], please send a message ensuring we have received those results.  Please allow up to 72 hours for review and response to your results if needed.     Emergency:   If you are having acute (new/sudden onset) or severe/worsening neurologic symptoms, please call 911 or seek care in the ED   After hours emergency: [phone], ask to speak with on-call neurologist    Helpful contact information:   For any imaging ordered on this visit please contact: [place] [facility] scheduling: please call [phone] at your convenience to schedule your studies.   Referrals placed during the visit: if you have not heard from scheduling within one week, please call the call center at [phone] to get scheduling assistance.   Refills on medications, please first contact your pharmacy, who will fax a refill authorization request form to our office.  Weekdays only. Allow up to 2 business days for refills. Please plan ahead, as refills will not be filled after hours.   Scheduling staff may be reached at [phone] option 1 for scheduling needs.    Urgent nursing needs: England Greb LPN may be contacted at [phone]. Staff will return your call within 24 business hours.       For Appointments:    To allow us to better care for you and stay on time, we appreciate you arriving 15 minutes prior to your scheduled appointment time.    If you are late to your appointment, we reserve the right to ask you to reschedule or wait until next available time to be seen in fairness to other patients scheduled that day.    There are times when we are running behind in clinic. Our goal is to always be on time, however, there are time when unexpected events occur with patients, which may cause a delay. We appreciate your understanding when this occurs.    If you have an MRI scheduled, please reach out to the clinic nurse ([phone]) or scheduling  ([phone]) ASAP to let them know the date, Dr. Lynch expects to see you in person or by telehealth for an appointment to go over those results with you.    [facility]   [facility]  Phone: [phone]  Fax: [phone]

## 2022-03-05 ENCOUNTER — Encounter: Admit: 2022-03-05 | Discharge: 2022-03-05 | Payer: Commercial Managed Care - PPO

## 2022-03-05 ENCOUNTER — Ambulatory Visit: Admit: 2022-03-05 | Discharge: 2022-03-05 | Payer: Commercial Managed Care - PPO

## 2022-03-05 DIAGNOSIS — Z79899 Other long term (current) drug therapy: Secondary | ICD-10-CM

## 2022-03-05 DIAGNOSIS — G629 Polyneuropathy, unspecified: Secondary | ICD-10-CM

## 2022-03-05 DIAGNOSIS — G43909 Migraine, unspecified, not intractable, without status migrainosus: Secondary | ICD-10-CM

## 2022-03-05 DIAGNOSIS — U071 COVID-19 virus detected: Secondary | ICD-10-CM

## 2022-03-05 DIAGNOSIS — A64 Unspecified sexually transmitted disease: Secondary | ICD-10-CM

## 2022-03-05 DIAGNOSIS — R4189 Other symptoms and signs involving cognitive functions and awareness: Secondary | ICD-10-CM

## 2022-03-05 DIAGNOSIS — I050 Rheumatic mitral stenosis: Secondary | ICD-10-CM

## 2022-03-05 DIAGNOSIS — H547 Unspecified visual loss: Secondary | ICD-10-CM

## 2022-03-05 DIAGNOSIS — M1990 Unspecified osteoarthritis, unspecified site: Secondary | ICD-10-CM

## 2022-03-05 DIAGNOSIS — R519 Generalized headaches: Secondary | ICD-10-CM

## 2022-03-05 DIAGNOSIS — G35 Multiple sclerosis: Secondary | ICD-10-CM

## 2022-03-05 DIAGNOSIS — I499 Cardiac arrhythmia, unspecified: Secondary | ICD-10-CM

## 2022-03-05 DIAGNOSIS — N390 Urinary tract infection, site not specified: Secondary | ICD-10-CM

## 2022-03-05 LAB — CBC AND DIFF
ABSOLUTE BASO COUNT: 0 K/UL (ref 0–0.20)
ABSOLUTE EOS COUNT: 0.1 K/UL (ref 0–0.45)
ABSOLUTE LYMPH COUNT: 1.6 K/UL (ref 1.0–4.8)
ABSOLUTE MONO COUNT: 0.4 K/UL (ref 0–0.80)
ABSOLUTE NEUTROPHIL: 3.1 K/UL (ref 1.8–7.0)
BASOPHILS %: 1 % (ref >60)
EOSINOPHILS %: 3 % (ref 0–5)
HEMATOCRIT: 40 % (ref 36–45)
HEMOGLOBIN: 13 g/dL (ref 12.0–15.0)
LYMPHOCYTES %: 30 % (ref 24–44)
MCH: 30 pg (ref 26–34)
MCHC: 34 g/dL (ref 32.0–36.0)
MCV: 88 FL (ref 80–100)
MONOCYTES %: 8 % (ref 4–12)
MPV: 7.7 FL — ABNORMAL HIGH (ref 7–11)
NEUTROPHILS %: 58 % (ref 41–77)
PLATELET COUNT: 312 K/UL (ref 150–400)
RBC COUNT: 4.6 M/UL (ref 4.0–5.0)
RDW: 12 % (ref 11–15)
WBC COUNT: 5.4 K/UL — ABNORMAL LOW (ref 4.5–11.0)

## 2022-03-05 LAB — IMMUNOGLOBULINS-IGA,IGG,IGM: IGG: 642 mg/dL — ABNORMAL LOW (ref 762–1488)

## 2022-03-05 LAB — COMPREHENSIVE METABOLIC PANEL: SODIUM: 141 MMOL/L (ref 137–147)

## 2022-03-05 LAB — VITAMIN B12: VITAMIN B12: 255 pg/mL (ref 180–914)

## 2022-03-05 LAB — TSH WITH FREE T4 REFLEX: TSH: 1 uU/mL (ref 0.35–5.00)

## 2022-03-05 MED ORDER — MODAFINIL 200 MG PO TAB
200 mg | ORAL_TABLET | Freq: Every day | ORAL | 3 refills
Start: 2022-03-05 — End: ?

## 2022-03-06 ENCOUNTER — Encounter: Admit: 2022-03-06 | Discharge: 2022-03-06 | Payer: Commercial Managed Care - PPO

## 2022-03-06 DIAGNOSIS — G629 Polyneuropathy, unspecified: Secondary | ICD-10-CM

## 2022-03-06 DIAGNOSIS — I499 Cardiac arrhythmia, unspecified: Secondary | ICD-10-CM

## 2022-03-06 DIAGNOSIS — M1990 Unspecified osteoarthritis, unspecified site: Secondary | ICD-10-CM

## 2022-03-06 DIAGNOSIS — R519 Generalized headaches: Secondary | ICD-10-CM

## 2022-03-06 DIAGNOSIS — U071 COVID-19 virus detected: Secondary | ICD-10-CM

## 2022-03-06 DIAGNOSIS — G43909 Migraine, unspecified, not intractable, without status migrainosus: Secondary | ICD-10-CM

## 2022-03-06 DIAGNOSIS — N390 Urinary tract infection, site not specified: Secondary | ICD-10-CM

## 2022-03-06 DIAGNOSIS — H547 Unspecified visual loss: Secondary | ICD-10-CM

## 2022-03-06 DIAGNOSIS — I050 Rheumatic mitral stenosis: Secondary | ICD-10-CM

## 2022-03-06 DIAGNOSIS — R4189 Other symptoms and signs involving cognitive functions and awareness: Secondary | ICD-10-CM

## 2022-03-06 DIAGNOSIS — G35 Multiple sclerosis: Secondary | ICD-10-CM

## 2022-03-06 DIAGNOSIS — A64 Unspecified sexually transmitted disease: Secondary | ICD-10-CM

## 2022-03-06 MED ORDER — MODAFINIL 200 MG PO TAB
200 mg | ORAL_TABLET | Freq: Every day | ORAL | 5 refills | Status: AC
Start: 2022-03-06 — End: ?

## 2022-03-06 NOTE — Telephone Encounter
Patient calls to follow up from office visit yesterday, was discussed starting medication for fatigue. Pharmacy has not received anything yet.   Ok to send in modafinil 200mg  daily?

## 2022-03-06 NOTE — Telephone Encounter
Patient was advised modafinil 200mg  was sent in to the pharmacy for her to start for fatigue

## 2022-03-20 ENCOUNTER — Encounter: Admit: 2022-03-20 | Discharge: 2022-03-20 | Payer: Commercial Managed Care - PPO

## 2022-03-20 NOTE — Telephone Encounter
Patient calling stating that she reached out to her speciality pharmacy to see what was delaying her infusion. States that they are needing an order for an epi pen. Contacted patient back, [facility]     [facility] at [phone] spoke with Emerson Monte  Will reach out to office in July when she needs a new epi pen when this one expires. The medication is set to ship 03/26/22

## 2022-03-21 ENCOUNTER — Encounter: Admit: 2022-03-21 | Discharge: 2022-03-21 | Payer: Commercial Managed Care - PPO

## 2022-03-21 NOTE — Progress Notes
SW l/m for Monica Burnett, offering to provide support, resources, or education that may assist with any needs she is experiencing.  She was encouraged to return the call to discuss needs in further detail.

## 2022-04-27 ENCOUNTER — Encounter: Admit: 2022-04-27 | Discharge: 2022-04-27 | Payer: Commercial Managed Care - PPO

## 2022-04-27 NOTE — Telephone Encounter
Spoke with Dr. Donnal Debar regarding COVID and symptoms, there is nothing else we need to do at this time.   Called patient to advise her, did inform her to reach out to [place] at her onset of symptoms - with in the first 1-2 days if she begins to have symptoms in the future . Encouraged her to rest, drink fluids as well.

## 2022-04-27 NOTE — Telephone Encounter
Patient calls stating she began having COVID-19 symptoms last Saturday 04-21-22, she was at work and was sent to the doctor and tested + [place] and is notable to return to work until Tuesday. Patient reports that symptoms were much worse last weekend. Current symptoms are lingering productive cough, congestion, headaches on and of. Is taking allergy med, mucinex, pseudoephedrine. Reports her MS symptoms are ok, is just having increased fatigue. Is asking if there is anything else she needs to be doing.

## 2022-05-07 ENCOUNTER — Encounter: Admit: 2022-05-07 | Discharge: 2022-05-07 | Payer: Commercial Managed Care - PPO

## 2022-05-07 DIAGNOSIS — G43109 Migraine with aura, not intractable, without status migrainosus: Secondary | ICD-10-CM

## 2022-05-07 MED ORDER — UBRELVY 50 MG PO TAB
ORAL_TABLET | ORAL | 5 refills | 30.00000 days | Status: AC
Start: 2022-05-07 — End: ?

## 2022-05-07 NOTE — Telephone Encounter
Refill request for Monica Burnett, last office visit 03-05-22 refills sent

## 2022-05-09 ENCOUNTER — Encounter: Admit: 2022-05-09 | Discharge: 2022-05-09 | Payer: Commercial Managed Care - PPO

## 2022-05-09 NOTE — Telephone Encounter
Patient calls following up from 04-27-22 call- was + COVID 19 04-27-22 went back to work 05-11-22. Has been feeling fine until this morning. Was having bilateral leg weakness, her legs gave out and she fell down and landed on her knees. She could not get up off the floor for quite some time, finally was able to get herself up. Also having bad headaches -did get her Roselyn Meier refilled today., has cough- worse at night, chills, congestion. Is unsure if this is still COVID - but never was feeling this bad, or something else is going on.

## 2022-05-14 ENCOUNTER — Encounter: Admit: 2022-05-14 | Discharge: 2022-05-14 | Payer: Commercial Managed Care - PPO

## 2022-06-08 ENCOUNTER — Encounter: Admit: 2022-06-08 | Discharge: 2022-06-08 | Payer: Commercial Managed Care - PPO

## 2022-06-08 NOTE — Progress Notes
Prior authorization for Tramadol was submitted through covermymeds will continue to follow

## 2022-06-13 ENCOUNTER — Encounter: Admit: 2022-06-13 | Discharge: 2022-06-13 | Payer: Commercial Managed Care - PPO

## 2022-06-29 ENCOUNTER — Encounter: Admit: 2022-06-29 | Discharge: 2022-06-29 | Payer: Commercial Managed Care - PPO

## 2022-06-29 NOTE — Telephone Encounter
Patient left message yesterday 06-28-22 at 3pm- that she was having trouble walking, legs were spastic and not cooperating and feeling unsteady.   Returned patients call to see how her symptoms are currently. Patient reports that today her symptoms are way better. She is steady, legs are not spastic and her legs are moving. Advised patient to take it east over the weekend , not over do it and rest, get enough sleep at night. Contact clinic Monday if her symptoms have progressed/ worsened , otherwise it sounds like they are returning back to her normal.

## 2022-07-12 ENCOUNTER — Encounter: Admit: 2022-07-12 | Discharge: 2022-07-12 | Payer: Commercial Managed Care - PPO

## 2022-07-12 ENCOUNTER — Ambulatory Visit: Admit: 2022-07-12 | Discharge: 2022-07-12 | Payer: Commercial Managed Care - PPO

## 2022-07-12 DIAGNOSIS — R4189 Other symptoms and signs involving cognitive functions and awareness: Secondary | ICD-10-CM

## 2022-07-12 DIAGNOSIS — G35 Multiple sclerosis: Secondary | ICD-10-CM

## 2022-08-23 ENCOUNTER — Encounter: Admit: 2022-08-23 | Discharge: 2022-08-23 | Payer: Commercial Managed Care - PPO

## 2022-08-23 NOTE — Telephone Encounter
Spoke with Dr. Burnadette Peter who recommended patient be seen for evaluation, patient scheduled Monday 08-27-22 at 2:20 pm

## 2022-08-23 NOTE — Telephone Encounter
Patient reports having a fall in the yard about a week ago due to her left foot catching. Since then her left foot has been dragging, also feel her left leg is heavier, and dragging the leg itself more. Did see her PCP 4 days ago to rule out a UTI which was negative. Asking for Dr. Emilee Hero recommendations

## 2022-08-27 ENCOUNTER — Encounter: Admit: 2022-08-27 | Discharge: 2022-08-27 | Payer: Commercial Managed Care - PPO

## 2022-08-27 ENCOUNTER — Ambulatory Visit: Admit: 2022-08-27 | Discharge: 2022-08-27 | Payer: Commercial Managed Care - PPO

## 2022-08-27 DIAGNOSIS — M4600 Spinal enthesopathy, site unspecified: Secondary | ICD-10-CM

## 2022-08-27 DIAGNOSIS — I499 Cardiac arrhythmia, unspecified: Secondary | ICD-10-CM

## 2022-08-27 DIAGNOSIS — U071 COVID-19 virus detected: Secondary | ICD-10-CM

## 2022-08-27 DIAGNOSIS — E538 Deficiency of other specified B group vitamins: Secondary | ICD-10-CM

## 2022-08-27 DIAGNOSIS — H547 Unspecified visual loss: Secondary | ICD-10-CM

## 2022-08-27 DIAGNOSIS — G43909 Migraine, unspecified, not intractable, without status migrainosus: Secondary | ICD-10-CM

## 2022-08-27 DIAGNOSIS — G629 Polyneuropathy, unspecified: Secondary | ICD-10-CM

## 2022-08-27 DIAGNOSIS — G35 Multiple sclerosis: Secondary | ICD-10-CM

## 2022-08-27 DIAGNOSIS — A64 Unspecified sexually transmitted disease: Secondary | ICD-10-CM

## 2022-08-27 DIAGNOSIS — N390 Urinary tract infection, site not specified: Secondary | ICD-10-CM

## 2022-08-27 DIAGNOSIS — R519 Nonintractable headache, unspecified chronicity pattern, unspecified headache type: Secondary | ICD-10-CM

## 2022-08-27 DIAGNOSIS — M1990 Unspecified osteoarthritis, unspecified site: Secondary | ICD-10-CM

## 2022-08-27 DIAGNOSIS — R4189 Other symptoms and signs involving cognitive functions and awareness: Secondary | ICD-10-CM

## 2022-08-27 DIAGNOSIS — I050 Rheumatic mitral stenosis: Secondary | ICD-10-CM

## 2022-08-27 LAB — IMMUNOGLOBULINS-IGA,IGG,IGM
IGA: 111 mg/dL (ref 70–390)
IGG: 639 mg/dL — ABNORMAL LOW (ref 762–1488)

## 2022-08-27 NOTE — Assessment & Plan Note
Will try a TPI today in clinic. Does not seem consistent with a migraine. Will continue her migraine medications in the meantime.

## 2022-08-27 NOTE — Assessment & Plan Note
1 rigger points were identified in the right occipital reigon region. The procedure of trigger point injection was explained to the patient, and the patient agreed to the procedure. Using a 3 ml syringe, a mixture of 40 mg of Depomedrol and 1 ml 1 % lidocaine was created. The area was prepped in sterile fashion and using a 27 gauge 1 1/4 inch needle, the trigger point was injected. 1/2 ml of the mixture was injected into the  trigger point. The patient experienced immediate relief.

## 2022-08-27 NOTE — Assessment & Plan Note
She reports that she was on supplements when we checked the level. I wonder if she has a malabsorption problem. We will switch to B12 IM injections due to this.

## 2022-08-28 ENCOUNTER — Encounter: Admit: 2022-08-28 | Discharge: 2022-08-28 | Payer: Commercial Managed Care - PPO

## 2022-08-28 DIAGNOSIS — N390 Urinary tract infection, site not specified: Secondary | ICD-10-CM

## 2022-08-28 DIAGNOSIS — G43909 Migraine, unspecified, not intractable, without status migrainosus: Secondary | ICD-10-CM

## 2022-08-28 DIAGNOSIS — I499 Cardiac arrhythmia, unspecified: Secondary | ICD-10-CM

## 2022-08-28 DIAGNOSIS — R519 Generalized headaches: Secondary | ICD-10-CM

## 2022-08-28 DIAGNOSIS — G35 Multiple sclerosis: Secondary | ICD-10-CM

## 2022-08-28 DIAGNOSIS — A64 Unspecified sexually transmitted disease: Secondary | ICD-10-CM

## 2022-08-28 DIAGNOSIS — R4189 Other symptoms and signs involving cognitive functions and awareness: Secondary | ICD-10-CM

## 2022-08-28 DIAGNOSIS — H547 Unspecified visual loss: Secondary | ICD-10-CM

## 2022-08-28 DIAGNOSIS — I050 Rheumatic mitral stenosis: Secondary | ICD-10-CM

## 2022-08-28 DIAGNOSIS — M1990 Unspecified osteoarthritis, unspecified site: Secondary | ICD-10-CM

## 2022-08-28 DIAGNOSIS — G629 Polyneuropathy, unspecified: Secondary | ICD-10-CM

## 2022-08-28 DIAGNOSIS — U071 COVID-19 virus detected: Secondary | ICD-10-CM

## 2022-08-28 MED ORDER — DALFAMPRIDINE 10 MG PO TB12
10 mg | ORAL_TABLET | Freq: Two times a day (BID) | ORAL | 5 refills | Status: AC
Start: 2022-08-28 — End: ?

## 2022-08-28 NOTE — Progress Notes
Pharmacy Medication Initial Assessment and Education Attempt    Indication/Regimen  The regimen of DALFAMPRIDINE indefinitely is appropriate for Monica Burnett who has Multiple sclerosis (HCC).    Renal dose adjustments are not required. Hepatic dose adjustments are not required. Dose titration is not required.    The patient has the ability to self-administer the medication(s).    Baseline Characteristics  Previous medications for this indication: none for walking  Current medications for this indication: Ocrevus  Allergy or intolerance to medications for this indication: None known    Screening and Monitoring  Baseline labs were completed and evaluated.  The patient did not report signs/symptoms of an infection.  The patient has orders for lab monitoring: yes. Labs are within range.    Therapeutic Goals and Monitoring  The goal of therapy is to improve gait.  25-foot walk test results: 11 seconds  Gait assessment: stable    Past Medical History and Comorbidities  Patient Active Problem List   Diagnosis    Rheumatoid arthritis (HCC)    Multiple sclerosis (HCC)    B12 deficiency    Fatigue    Breast cyst    Difficulty urinating    Bradycardia    Mood disorder (HCC)    Greater trochanteric bursitis of right hip    Migraine with aura and without status migrainosus, not intractable    Headache    Spinal enthesopathy (HCC)     Additional comorbidities: yes  Past Medical History:  No date: Arthritis  No date: Cardiac dysrhythmia      Comment:  Only with certain meds  12/2018: COVID-19 virus detected  2015: Disorganized thinking      Comment:  Hard to keep on track  No date: Generalized headaches  Can't recall: Migraines      Comment:  3 or 4 years now  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)  2013: Multiple sclerosis (HCC)      Comment:  Dr Ala Dach @ [facility]  No date: Neuropathy  09/2008: Sexually transmitted disease      Comment:  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  No date: Urinary tract infection  No date: Vision decreased      Labs and Diagnostic Tests  CBC  Lab Results   Component Value Date/Time    HGB 13.9 03/05/2022 04:28 PM    HCT 40.9 03/05/2022 04:28 PM    PLTCT 312 03/05/2022 04:28 PM    WBC 5.4 03/05/2022 04:28 PM    NEUT 58 03/05/2022 04:28 PM    ANC 3.11 03/05/2022 04:28 PM    LYMA 30 03/05/2022 04:28 PM    ALC 1.60 03/05/2022 04:28 PM    MONA 8 03/05/2022 04:28 PM    AMC 0.41 03/05/2022 04:28 PM    EOSA 3 03/05/2022 04:28 PM    AEC 0.17 03/05/2022 04:28 PM    BASA 1 03/05/2022 04:28 PM    ABC 0.07 03/05/2022 04:28 PM    RBC 4.62 03/05/2022 04:28 PM    MCV 88.5 03/05/2022 04:28 PM    MCH 30.1 03/05/2022 04:28 PM    MCHC 34.0 03/05/2022 04:28 PM    MPV 7.7 03/05/2022 04:28 PM    RDW 12.8 03/05/2022 04:28 PM       CMP  Lab Results   Component Value Date    NA 141 03/05/2022    K 3.8 03/05/2022    CL 103 03/05/2022    CO2 30 03/05/2022    GAP 8 03/05/2022    GLU  96 03/05/2022    ALBUMIN 4.5 03/05/2022    GLOBULIN 2.4 01/07/2017    CA 9.4 03/05/2022    TOTBILI 0.4 03/05/2022       LFT  Lab Results   Component Value Date    AST 15 03/05/2022    ALT 14 03/05/2022    ALKPHOS 116 (H) 03/05/2022    TOTBILI 0.4 03/05/2022       Thyroid Function  TSH   Date Value Ref Range Status   03/05/2022 1.02 0.35 - 5.00 MCU/ML Final       Height and Weight  Wt Readings from Last 1 Encounters:   08/27/22 63.9 kg (140 lb 12.8 oz)     Ht Readings from Last 1 Encounters:   08/27/22 162.6 cm (5' 4)       Vitals  There were no vitals filed for this visit.    Pregnancy Test  No results found for: URPREGPOC, UHCG    Hepatitis B screening   HBsAg   Date/Time Value Ref Range Status   06/27/2017 12:00 AM negative  Final     Anti HBs Qnt   Date/Time Value Ref Range Status   06/27/2017 12:00 AM non- reactive  Final        Hepatitis B DNA   No results found for: HEPBQNTPLAS    Hepatitis C Screening   No results found for: HEPBCTOTAL, HEPCAB    TB Screening   Quantiferon TB   Date Value Ref Range Status   10/13/2013 NEGATIVE NEGATIVE Final     Comment:     Negative test result. M. tuberculosis complex   infection unlikely.         HIV Screening   No results found for: HIV12AGABSCN    VZV          Allergies  Allergies   Allergen Reactions    Penicillins RASH    Gabapentin PALPITATIONS        Immunizations  Vaccine history was reviewed with the patient. Education was provided on the importance of completing vaccines, including annual influenza vaccine (inactivated). Ocrevus is immunosuppressive The patient should avoid live vaccines. Ocrevus is immunosuppressive      There is no immunization history on file for this patient.    Medication Reconciliation  Medication history and reconciliation were performed (including prescription medications, supplements, over the counter, and herbal products). The medication list was updated and the patient's current medication list is included. The patient was instructed to speak with their health care provider before starting any new drug, including prescription or over the counter, natural / herbal products, or vitamins.    Drug Interactions    Drug-Drug Interactions  Drug-drug interactions were evaluated. There were not clinically significant drug-drug interactions.     Drug-Food Interactions  Drug-food interactions were evaluated. There are not clinically significant drug-food interactions.    dalfampridine should be taken with or without food.    Home Medications    Medication Sig   ALPRAZolam (XANAX) 0.25 mg tablet Take one tablet by mouth daily as needed for Anxiety.   cholecalciferol (VITAMIN D-3) 1,000 units tablet Take one tablet by mouth as Needed.   cyanocobalamin (vitamin B-12) (VITAMIN B12 PO) Take  by mouth daily.   cyclobenzaprine (FLEXERIL) 10 mg tablet TAKE ONE TABLET BY MOUTH TWICE DAILY AS NEEDED FOR MUSCLE CRAMPS.   dalfampridine (AMPYRA) 10 mg tablet Take one tablet by mouth every 12 hours. Do not crush, split or chew.   ocrelizumab (OCREVUS IV) Administer  600 mg through vein every 180 days.   propranolol LA (INDERAL LA) 60 mg capsule Take one capsule by mouth daily.   sertraline (ZOLOFT) 50 mg tablet TAKE 1 TABLET BY MOUTH DAILY   traMADoL (ULTRAM) 50 mg tablet Take one tablet by mouth daily as needed for Pain.   ubrogepant (UBRELVY) 50 mg tablet TAKE 1 TABLET BY MOUTH AT ONSET OF HEADACHE . MAY REPEAT IN TWO HOURS AS NEEDED NO MORE THAN 2 TABLETS PER 24 HOURS     Adverse Drug Reactions  Patient was educated on common side effects.    Adherence  Patient was educated on the importance of adherence.    Safety Precautions    Risk Evaluation and Mitigation (REMS) Assessment: REMS is not required for this medication.    Safety precautions were addressed and discussed with the patient as applicable.    Contraindications: Monica Burnett does not have contraindications to this medication.      Pregnancy Status: Female, not of child-bearing potential, education not applicable.    Medication Education  The patient was counseled via telephone. 15 minutes were spent educating the patient.    Monica Burnett was provided with education on their specialty medication(s). Discussion with the patient included: the medication name (brand and generic), medication class, dosing, frequency, duration, route, proper administration, monitoring, common side effects, contraindications, safety precautions, and food/drug interactions to be aware of. The indication, expectations and possible outcomes from treatment were also discussed.     Appropriate storage, safe handling, and disposal directions were reviewed. The patient was educated on timely administration of therapy and management of missed doses. Adherence with therapy and the patient's ability to be adherent with drug therapies were discussed and the patient was provided options for tools/resources that promote adherence to therapy as needed. The patient's ability to self-administer the medication was assessed. Requirements of the REMS program were discussed with the patient as applicable. Recommended vaccinations were reviewed and discussed with the patient as applicable. The patient was instructed to seek medical attention immediately if they experience signs of an allergic reaction, including but not limited to: a rash; hives; itching; red, swollen, blistered, or peeling skin with or without fever.     Monica Burnett was given the opportunity to ask questionsFollow-up Plan  The patient will be reassessed within 60 days of starting the medication.    The filling pharmacy is unknown at this time and will be determined at a later date.    Monica Burnett, PHARMD

## 2022-08-29 ENCOUNTER — Encounter: Admit: 2022-08-29 | Discharge: 2022-08-29 | Payer: Commercial Managed Care - PPO

## 2022-08-29 DIAGNOSIS — G35 Multiple sclerosis: Secondary | ICD-10-CM

## 2022-08-29 NOTE — Progress Notes
Referral for PT sent to patient

## 2022-08-29 NOTE — Progress Notes
Pharmacy Benefits Investigation    Medication name: DALFAMPRIDINE PO    The insurance requires a prior authorization for the medication. The prior authorization was submitted via Prompt[employer].    EOC ID: [id number]    Shelva Majestic  Specialty Pharmacy Patient Advocate

## 2022-08-29 NOTE — Progress Notes
Pharmacy Benefits Investigation    Medication name: DALFAMPRIDINE PO    The prior authorization was denied by insurance.    Insurance provided the following reasons for the denial: See below.    If an appeal is pursued, it should be submitted by fax to [phone].    If an alternative therapy is not appropriate and the prescribed medication and dose are necessary, the insurance will require the decision be appealed. This will require a letter for medical necessity. The clinic pharmacist was notified. To proceed with an appeal, completed appeal materials should be returned to the specialty pharmacy team. Will follow up in 7 business days if no response. Please contact the specialty pharmacy with any questions regarding next steps.    Shelva Majestic  Specialty Pharmacy Patient Advocate

## 2022-08-30 ENCOUNTER — Encounter: Admit: 2022-08-30 | Discharge: 2022-08-30 | Payer: Commercial Managed Care - PPO

## 2022-09-03 ENCOUNTER — Encounter: Admit: 2022-09-03 | Discharge: 2022-09-03 | Payer: Commercial Managed Care - PPO

## 2022-09-03 NOTE — Progress Notes
Specialty Medication Appeal    Medication name: DALFAMPRIDINE 10 MG PO TB12    The prior authorization was denied by insurance due to Unmet criteria. The pharmacist has written an appeal letter including pertinent patient information and references indicating the need for the medication.    The appeal letter was given to the specialty pharmacy team to submit to the patient's insurance.    Elenor Quinones, PHARMD

## 2022-09-03 NOTE — Progress Notes
Pharmacy Benefits Investigation    Medication name: DALFAMPRIDINE 10 MG PO TB12    Appeal level: first    The appeal was approved for Monica Burnett from 09/03/2022 through 12/02/2022. The authorization number is [id number].    The out of pocket cost is unknown because the medication cannot be filled at [facility] [place] [facility]. The patient's insurance mandates they fill the medication at [facility]) - [place], [place] - [address]    Notified the clinic pharmacist for next steps.    Shelva Majestic  Specialty Pharmacy Patient Advocate

## 2022-09-04 ENCOUNTER — Encounter: Admit: 2022-09-04 | Discharge: 2022-09-04 | Payer: Commercial Managed Care - PPO

## 2022-09-04 MED ORDER — DALFAMPRIDINE 10 MG PO TB12
10 mg | ORAL_TABLET | Freq: Two times a day (BID) | ORAL | 5 refills | Status: AC
Start: 2022-09-04 — End: ?

## 2022-09-04 NOTE — Progress Notes
Called patient to notify her of medication approval. Sent script to [facility].

## 2022-09-06 ENCOUNTER — Encounter: Admit: 2022-09-06 | Discharge: 2022-09-06 | Payer: Commercial Managed Care - PPO

## 2022-09-06 DIAGNOSIS — G35 Multiple sclerosis: Secondary | ICD-10-CM

## 2022-09-06 DIAGNOSIS — Z79899 Other long term (current) drug therapy: Secondary | ICD-10-CM

## 2022-09-06 NOTE — Progress Notes
Mailed patients standing lab order to address in O2

## 2022-09-10 ENCOUNTER — Encounter: Admit: 2022-09-10 | Discharge: 2022-09-10 | Payer: Commercial Managed Care - PPO

## 2022-09-10 NOTE — Telephone Encounter
Contacted Pension scheme manager and advised them Ocrevus is on hold , extended interval dosing , re-checking labs every 3 months. If Dr. Burnadette Peter indicated the need for an infusion we will reach out to them and let them know.   They have this notes from the patient as patient contacted them with these instructions also and now our office as well.

## 2022-09-10 NOTE — Telephone Encounter
[facility] calls asking for Ocrevus orders. Per recent visit note Dr. Burnadette Peter wanted lymphocyte subset labs & Ig levels done and switch to extended interval dosing.   Will confirm with Dr. Burnadette Peter holding Monica Burnett and re-checking labs again in 3 months

## 2022-09-28 ENCOUNTER — Encounter: Admit: 2022-09-28 | Discharge: 2022-09-28 | Payer: Commercial Managed Care - PPO

## 2022-09-28 NOTE — Telephone Encounter
Patient calls reporting that she has noticed over the past few days a lot of bruising on her body. She began the B12 injections a month ago doing weekly injections and will now go to monthly injections with next injection 10-24-22. Reports also started the dalfampridine as well about a month ago as well taking it a 8 am and 8 pm. Reports no other symptoms that are new, no GI symptoms, no dark stools. Reports constipation , which she always has.     Advised patient to reach out to PCP, and if she develops any GI symptoms, dark tarry stools, bruising gets worse to go to ED.   Will forward to Dr. Burnadette Peter for her thoughts and recommendations and get back to her Monday once Dr. Burnadette Peter is back in office.

## 2022-09-28 NOTE — Telephone Encounter
Patient returns call leaves message that she called her PCP who is calling in a daily B12 injection for 5 days

## 2022-10-16 ENCOUNTER — Encounter: Admit: 2022-10-16 | Discharge: 2022-10-16 | Payer: Commercial Managed Care - PPO

## 2022-10-16 NOTE — Progress Notes
Specialty Medication Reassessment Attempt    Medication name: DALFAMPRIDINE    Attempt to reach Monica Burnett to verify compliance and assess tolerance of their specialty medication. Message sent to patient via MyChart.    Lilli Few, PHARMD

## 2022-10-22 ENCOUNTER — Encounter: Admit: 2022-10-22 | Discharge: 2022-10-22 | Payer: Commercial Managed Care - PPO

## 2022-10-22 NOTE — Telephone Encounter
Pt LVM wanting to let us know that she fell yesterday and ended up in the ER and has a boot and splint on her finger. She had a broken toe and she thinks a chipped finger. She is trying to let everyone know.

## 2022-11-02 ENCOUNTER — Encounter: Admit: 2022-11-02 | Discharge: 2022-11-02 | Payer: Commercial Managed Care - PPO

## 2022-11-02 MED ORDER — TRAMADOL 50 MG PO TAB
50 mg | ORAL_TABLET | Freq: Every day | ORAL | 2 refills | Status: AC | PRN
Start: 2022-11-02 — End: ?

## 2022-11-02 NOTE — Telephone Encounter
Patient calls asking for Tramadol refill- last refill 01-04-22 #30 + 2 , pended to Dr. Burnadette Peter to approve.   Immunoglobulins and Lymphocyte subset panel labs faxed to [facility] at [phone], patient next lab due in September. Mychart message sent to patient advising her when due.

## 2022-11-07 ENCOUNTER — Encounter: Admit: 2022-11-07 | Discharge: 2022-11-07 | Payer: Commercial Managed Care - PPO

## 2022-11-07 MED ORDER — DALFAMPRIDINE 10 MG PO TB12
10 mg | ORAL_TABLET | Freq: Two times a day (BID) | ORAL | 5 refills | Status: AC
Start: 2022-11-07 — End: ?

## 2022-11-07 MED ORDER — DALFAMPRIDINE 10 MG PO TB12
10 mg | ORAL_TABLET | Freq: Two times a day (BID) | ORAL | 5 refills | Status: DC
Start: 2022-11-07 — End: 2022-11-07

## 2022-11-07 NOTE — Telephone Encounter
[facility] faxes PA needed, sent to [facility] to complete

## 2022-11-07 NOTE — Progress Notes
Pharmacy Benefits Investigation    Medication name: DALFAMPRIDINE 10 MG PO TB12    The medication is covered by insurance with no specified end date.    The out of pocket cost is unknown because the medication cannot be filled at [facility] [place] [facility]. The patient's insurance mandates they fill the medication at [facility]) - [place], [place] - [address]    Notified the clinic pharmacist for next steps.    Shelva Majestic  Specialty Pharmacy Patient Advocate

## 2022-11-07 NOTE — Telephone Encounter
PA for dalfampridine with no end date- sent to [facility]

## 2022-11-27 ENCOUNTER — Encounter: Admit: 2022-11-27 | Discharge: 2022-11-27 | Payer: Commercial Managed Care - PPO

## 2022-12-06 ENCOUNTER — Encounter: Admit: 2022-12-06 | Discharge: 2022-12-06 | Payer: Commercial Managed Care - PPO

## 2022-12-06 DIAGNOSIS — E538 Deficiency of other specified B group vitamins: Secondary | ICD-10-CM

## 2022-12-06 DIAGNOSIS — R519 Nonintractable headache, unspecified chronicity pattern, unspecified headache type: Secondary | ICD-10-CM

## 2022-12-06 DIAGNOSIS — G35 Multiple sclerosis: Secondary | ICD-10-CM

## 2022-12-13 ENCOUNTER — Encounter: Admit: 2022-12-13 | Discharge: 2022-12-13 | Payer: Commercial Managed Care - PPO

## 2022-12-13 NOTE — Progress Notes
[employer] [place] Attending Physician Statement for Disability form completed and faxed back to [phone]

## 2022-12-27 ENCOUNTER — Encounter: Admit: 2022-12-27 | Discharge: 2022-12-27 | Payer: Commercial Managed Care - PPO

## 2022-12-27 MED ORDER — DALFAMPRIDINE 10 MG PO TB12
10 mg | ORAL_TABLET | Freq: Two times a day (BID) | ORAL | 5 refills | Status: AC
Start: 2022-12-27 — End: ?

## 2022-12-27 NOTE — Telephone Encounter
PA approved and mandated to [facility]- patient was called and notified

## 2022-12-27 NOTE — Progress Notes
Pharmacy Benefits Investigation    Medication name: DALFAMPRIDINE 10 MG PO TB12    The insurance requires a prior authorization for the medication. The prior authorization renewal was submitted via CoverMyMeds.    PA number: [id number]    The prior authorization renewal was reapproved for Monica Burnett (PA number [id number]) from 12/27/2022 through 12/27/2023.    The out of pocket cost is unknown because the medication cannot be filled at [facility] [place] [facility]. The patient's insurance mandates they fill the medication at [facility] [place], [place] - [address]    Notified the clinic for next steps.    Shelva Majestic  Specialty Pharmacy Patient Advocate

## 2022-12-28 ENCOUNTER — Encounter: Admit: 2022-12-28 | Discharge: 2022-12-28 | Payer: Commercial Managed Care - PPO

## 2023-01-11 ENCOUNTER — Encounter: Admit: 2023-01-11 | Discharge: 2023-01-11 | Payer: Commercial Managed Care - PPO

## 2023-01-15 ENCOUNTER — Encounter: Admit: 2023-01-15 | Discharge: 2023-01-15 | Payer: Commercial Managed Care - PPO

## 2023-02-21 ENCOUNTER — Encounter: Admit: 2023-02-21 | Discharge: 2023-02-21 | Payer: Commercial Managed Care - PPO

## 2023-02-25 ENCOUNTER — Encounter: Admit: 2023-02-25 | Discharge: 2023-02-25 | Payer: Commercial Managed Care - PPO

## 2023-02-27 ENCOUNTER — Ambulatory Visit: Admit: 2023-02-27 | Discharge: 2023-02-27 | Payer: Commercial Managed Care - PPO

## 2023-02-27 ENCOUNTER — Encounter: Admit: 2023-02-27 | Discharge: 2023-02-27 | Payer: Commercial Managed Care - PPO

## 2023-03-02 ENCOUNTER — Encounter: Admit: 2023-03-02 | Discharge: 2023-03-02 | Payer: Commercial Managed Care - PPO

## 2023-03-04 ENCOUNTER — Encounter: Admit: 2023-03-04 | Discharge: 2023-03-04 | Payer: Commercial Managed Care - PPO

## 2023-03-06 ENCOUNTER — Encounter: Admit: 2023-03-06 | Discharge: 2023-03-06 | Payer: Commercial Managed Care - PPO

## 2023-04-05 ENCOUNTER — Encounter: Admit: 2023-04-05 | Discharge: 2023-04-05 | Payer: Commercial Managed Care - PPO

## 2023-04-10 ENCOUNTER — Encounter: Admit: 2023-04-10 | Discharge: 2023-04-10 | Payer: Commercial Managed Care - PPO

## 2023-04-10 DIAGNOSIS — Z79899 Other long term (current) drug therapy: Secondary | ICD-10-CM

## 2023-04-10 DIAGNOSIS — G35 Multiple sclerosis: Secondary | ICD-10-CM

## 2023-04-18 ENCOUNTER — Encounter: Admit: 2023-04-18 | Discharge: 2023-04-18 | Payer: Commercial Managed Care - PPO

## 2023-04-18 NOTE — Progress Notes
Patients KPERS paperwork filled out and faxed to [phone]

## 2023-04-24 ENCOUNTER — Encounter: Admit: 2023-04-24 | Discharge: 2023-04-24 | Payer: Commercial Managed Care - PPO

## 2023-04-25 ENCOUNTER — Encounter: Admit: 2023-04-25 | Discharge: 2023-04-25 | Payer: Commercial Managed Care - PPO

## 2023-05-14 ENCOUNTER — Encounter: Admit: 2023-05-14 | Discharge: 2023-05-14 | Payer: Commercial Managed Care - PPO

## 2023-05-14 DIAGNOSIS — G35 Multiple sclerosis: Secondary | ICD-10-CM

## 2023-05-14 MED ORDER — SERTRALINE 50 MG PO TAB
50 mg | ORAL_TABLET | Freq: Every day | ORAL | 5 refills | Status: AC
Start: 2023-05-14 — End: ?

## 2023-05-14 NOTE — Progress Notes
 Sent infusion orders to [facility] per patient request   Faxed to [phone]    Customer support number [phone]

## 2023-05-14 NOTE — Telephone Encounter
 Refill request for sertraline, last office visit 02-27-23 refills sent

## 2023-05-20 ENCOUNTER — Encounter: Admit: 2023-05-20 | Discharge: 2023-05-20 | Payer: Commercial Managed Care - PPO

## 2023-05-20 NOTE — Progress Notes
 [facility]     Phone- [phone]- [phone] opt 3   Fax- [phone]

## 2023-05-23 ENCOUNTER — Encounter: Admit: 2023-05-23 | Discharge: 2023-05-23 | Payer: Commercial Managed Care - PPO

## 2023-05-23 NOTE — Progress Notes
 Signed Ocrevus orders faxed to [facility] at [phone]  Ph: [phone]

## 2023-05-28 ENCOUNTER — Encounter: Admit: 2023-05-28 | Discharge: 2023-05-28 | Payer: Commercial Managed Care - PPO

## 2023-06-12 ENCOUNTER — Encounter: Admit: 2023-06-12 | Discharge: 2023-06-12 | Payer: Commercial Managed Care - PPO

## 2023-06-17 ENCOUNTER — Encounter: Admit: 2023-06-17 | Discharge: 2023-06-17

## 2023-06-17 DIAGNOSIS — G43109 Migraine with aura, not intractable, without status migrainosus: Secondary | ICD-10-CM

## 2023-06-17 MED ORDER — TRAMADOL 50 MG PO TAB
1 | ORAL_TABLET | ORAL | 2 refills | Status: AC | PRN
Start: 2023-06-17 — End: ?

## 2023-06-17 MED ORDER — UBRELVY 50 MG PO TAB
ORAL_TABLET | ORAL | 5 refills | 30.00000 days | Status: AC
Start: 2023-06-17 — End: ?

## 2023-07-10 ENCOUNTER — Encounter: Admit: 2023-07-10 | Discharge: 2023-07-10 | Payer: PRIVATE HEALTH INSURANCE

## 2023-07-10 MED ORDER — DALFAMPRIDINE 10 MG PO TB12
ORAL_TABLET | 5 refills | Status: AC
Start: 2023-07-10 — End: ?

## 2023-07-10 NOTE — Telephone Encounter
 Refill request for dalfampridine , last office visit 02-27-23, rx included in plan of care, labs up to date ,refills sent

## 2023-08-14 ENCOUNTER — Encounter: Admit: 2023-08-14 | Discharge: 2023-08-14 | Payer: PRIVATE HEALTH INSURANCE

## 2023-08-14 NOTE — Telephone Encounter
 Patient called and states she had a fall this weekend. Patient reports she broke her femur and also broke her foot. She states she is also being referred to an orthopedic team because there may be another fracture they did not see at the time. States she just wanted us  to know . Patient states its ok to call her if we have any questions .

## 2023-08-21 ENCOUNTER — Encounter: Admit: 2023-08-21 | Discharge: 2023-08-21 | Payer: PRIVATE HEALTH INSURANCE

## 2023-08-28 ENCOUNTER — Encounter: Admit: 2023-08-28 | Discharge: 2023-08-28 | Payer: PRIVATE HEALTH INSURANCE

## 2023-08-28 ENCOUNTER — Ambulatory Visit: Admit: 2023-08-28 | Discharge: 2023-08-29 | Payer: PRIVATE HEALTH INSURANCE

## 2023-08-29 ENCOUNTER — Encounter: Admit: 2023-08-29 | Discharge: 2023-08-29 | Payer: PRIVATE HEALTH INSURANCE

## 2023-09-10 ENCOUNTER — Encounter: Admit: 2023-09-10 | Discharge: 2023-09-10 | Payer: PRIVATE HEALTH INSURANCE

## 2023-09-11 ENCOUNTER — Encounter: Admit: 2023-09-11 | Discharge: 2023-09-11 | Payer: PRIVATE HEALTH INSURANCE

## 2023-09-16 ENCOUNTER — Encounter: Admit: 2023-09-16 | Discharge: 2023-09-16 | Payer: PRIVATE HEALTH INSURANCE

## 2023-09-19 ENCOUNTER — Encounter: Admit: 2023-09-19 | Discharge: 2023-09-19 | Payer: PRIVATE HEALTH INSURANCE

## 2023-09-24 ENCOUNTER — Encounter: Admit: 2023-09-24 | Discharge: 2023-09-24 | Payer: PRIVATE HEALTH INSURANCE

## 2023-09-24 NOTE — Progress Notes
 Pharmacy Medication Reassessment    Indication/Regimen  The regimen of DALFAMPRIDINE  10 MG PO TB12 indefinitely is appropriate for Monica Burnett who has Multiple sclerosis (CMS-HCC).    Renal dose adjustments are not required. Hepatic dose adjustments are not required. Dose titration is not required. [name] started taking the medication on 09/01/2022.    The patient has the ability to self-administer the medication(s).    Baseline Characteristics  Previous medications for this indication: none for walking  Current medications for this indication: Ocrevus   Allergy or intolerance to medications for this indication: None known    Screening and Monitoring  Baseline labs were completed and evaluated.  The patient did not report signs/symptoms of an infection.  The patient has orders for lab monitoring: yes. Labs are within range.    Therapeutic Goals and Monitoring  The goal of therapy is to improve gait.  Patient is currently having trouble walking due to breaking her leg a few weeks ago  25-foot walk test results: 11 seconds  Gait assessment: stable    The patient is making progress toward achieving their therapeutic goal. patient tolerating and reports benefit The plan is to continue current therapy. patient tolerating and reports benefit    Past Medical History and Comorbidities  Patient Active Problem List   Diagnosis    Rheumatoid arthritis (CMS-HCC)    Multiple sclerosis (CMS-HCC)    B12 deficiency    Fatigue    Breast cyst    Difficulty urinating    Bradycardia    Mood disorder (HCC)    Greater trochanteric bursitis of right hip    Migraine with aura and without status migrainosus, not intractable    Headache    Spinal enthesopathy    Urinary urgency    Recurrent UTI    Other constipation     Additional comorbidities: yes  Past Medical History:  No date: Arthritis  No date: Cardiac dysrhythmia      Comment:  Only with certain meds  12/2018: COVID-19 virus detected  2015: Disorganized thinking      Comment:  Hard to keep on track  No date: Generalized headaches  Can't recall: Migraines      Comment:  3 or 4 years now  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)  2013: Multiple sclerosis (HCC)      Comment:  Dr Primus @  Neurology  No date: Neuropathy  09/2008: Sexually transmitted disease      Comment:  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  No date: Urinary tract infection  No date: Vision decreased      Labs and Diagnostic Tests  CBC  Lab Results   Component Value Date/Time    HGB 13.9 03/05/2022 04:28 PM    HCT 40.9 03/05/2022 04:28 PM    PLTCT 312 03/05/2022 04:28 PM    WBC 5.4 03/05/2022 04:28 PM    NEUT 58 03/05/2022 04:28 PM    ANC 3.11 03/05/2022 04:28 PM    LYMA 30 03/05/2022 04:28 PM    ALC 1.60 03/05/2022 04:28 PM    MONA 8 03/05/2022 04:28 PM    AMC 0.41 03/05/2022 04:28 PM    EOSA 3 03/05/2022 04:28 PM    AEC 0.17 03/05/2022 04:28 PM    BASA 1 03/05/2022 04:28 PM    ABC 0.07 03/05/2022 04:28 PM    RBC 4.62 03/05/2022 04:28 PM    MCV 88.5 03/05/2022 04:28 PM    MCH 30.1 03/05/2022 04:28 PM  MCHC 34.0 03/05/2022 04:28 PM    MPV 7.7 03/05/2022 04:28 PM    RDW 12.8 03/05/2022 04:28 PM       CMP  Lab Results   Component Value Date    NA 140 03/04/2023    K 4.0 03/04/2023    CL 101 03/04/2023    CO2 35 (H) 03/04/2023    GAP 8 03/05/2022    GLU 63 (L) 03/04/2023    ALBUMIN 4.5 03/04/2023    GLOBULIN 2.3 03/04/2023    CA 9.6 03/04/2023    TOTBILI 0.6 03/04/2023       LFT  Lab Results   Component Value Date    AST 14 03/04/2023    ALT 13 03/04/2023    ALKPHOS 119 03/04/2023    TOTBILI 0.6 03/04/2023       Thyroid Function  Lab Results   Component Value Date    TSH 1.02 03/05/2022       Height and Weight  Wt Readings from Last 1 Encounters:   08/28/23 65.8 kg (145 lb)     Ht Readings from Last 1 Encounters:   08/28/23 162.6 cm (5' 4)       Vitals  There were no vitals filed for this visit.    Pregnancy Test  No results found for: URPREGPOC, UHCG    Hepatitis B screening   HBsAg   Date/Time Value Ref Range Status   06/27/2017 12:00 AM negative  Final     Anti HBs Qnt   Date/Time Value Ref Range Status   06/27/2017 12:00 AM non- reactive  Final        Hepatitis B DNA   No results found for: HEPBQNTPLAS    Hepatitis C Screening   No results found for: HEPBCTOTAL, HEPCAB    TB Screening   Quantiferon TB   Date Value Ref Range Status   10/13/2013 NEGATIVE NEGATIVE Final     Comment:     Negative test result. M. tuberculosis complex   infection unlikely.         HIV Screening   No results found for: HIV12AGABSCN    VZV          Allergies  Allergies   Allergen Reactions    Penicillins RASH    Gabapentin  PALPITATIONS        Immunizations  Vaccine history was reviewed with the patient. Education was provided on the importance of completing vaccines, including annual influenza vaccine (inactivated). Ocrevus  is immunosuppressive The patient should avoid live vaccines. Ocrevus  is immunosuppressive      There is no immunization history on file for this patient.    Medication Reconciliation  Medication history and reconciliation were performed (including prescription medications, supplements, over the counter, and herbal products). The medication list was updated and the patient's current medication list is included. The patient was instructed to speak with their health care provider before starting any new drug, including prescription or over the counter, natural / herbal products, or vitamins.    Drug Interactions    Drug-Drug Interactions  Drug-drug interactions were evaluated. There were not clinically significant drug-drug interactions.     Drug-Food Interactions  Drug-food interactions were evaluated. There are not clinically significant drug-food interactions.    dalfampridine  should be taken with or without food.    Home Medications    Medication Sig   cholecalciferol (VITAMIN D-3) 1,000 units tablet Take one tablet by mouth as Needed.   cyanocobalamin (vitamin B-12) (RUBRAMIN PC) 1,000 mcg/mL injection solution  Inject 1mL intramuscularly once a month   cyanocobalamin (vitamin B-12) (VITAMIN B12 PO) Take  by mouth daily.   cyclobenzaprine  (FLEXERIL ) 10 mg tablet TAKE 1 TABLET BY MOUTH TWICE DAILY AS NEEDED MUSCLE CRAMPS   dalfampridine  (AMPYRA ) 10 mg tablet Take 1 tablet by mouth every 12 hours. Do not crush, split or chew.   ocrelizumab  (OCREVUS  IV) Administer 600 mg through vein every 180 days.   predniSONE  (DELTASONE ) 20 mg tablet Take 3 tabs(60mg ) daily x 5 days,then 2.5 tabs(50mg ) daily x 5 days,then 2 tabs (40mg ) daily x 5 days,then 1.5 tabs (30mg ) daily x 5 days, then 1 tab(20mg )  daily x 5 days, then 0.5 tab(10mg ) daily x 5days. Then stop   sertraline  (ZOLOFT ) 50 mg tablet TAKE 1 TABLET BY MOUTH ONCE DAILY   Syringe with Needle (Disp) 3 mL 25 gauge x 1 syrg Use with B12 injections -   monthly   traMADoL  (ULTRAM ) 50 mg tablet TAKE 1 TABLET BY MOUTH ONCE DAILY AS NEEDED FOR PAIN   ubrogepant  (UBRELVY ) 50 mg tablet TAKE 1 TABLET BY MOUTH AT ONSET OF HEADACHE MAY REPEAT IN 2 HOURS IF NEEDED NO MORE THAN 2 TABLETS PER 24 HOURS     Adverse Drug Reactions  Adverse drug reactions were reviewed with the patient.    Significant adverse drug reaction(s) were not identified.    Side effect(s) were not reported.    The patient does not have infection related concerns.    Adherence  Refill and adherence history were reviewed with the patient. The patient was educated on the importance of adherence.    Patient is adherent with refills: yes  Patient is meeting refill adherence goal: yes    Patient reported 0 missed doses over the past 1 month.  Significance of missed doses: NA - no missed doses   Patient is meeting reported adherence goal.    Safety Precautions    Risk Evaluation and Mitigation (REMS) Assessment: REMS is not required for this medication.    Safety precautions were addressed and discussed with the patient as applicable. CrCl: 69 ml/min from December 2024    Contraindications: Zaira Iacovelli does not have contraindications to this medication.    Pregnancy Status: Female, not of child-bearing potential, education not applicable.    Medication Education  Counseling was not completed because patient was previously educated and did not require additional counseling.    Monica Burnett was given the opportunity to ask questions. Patient did not have any questions at this time. Patient was reminded of the refill process and encouraged to call with questions. The monitoring and follow-up plan was discussed with the patient. The patient was instructed to contact their health care provider if their symptoms or health problems do not get better or if they become worse. For clinical questions about this medication, the pharmacist can be reached at [phone]. For questions about cost, insurance coverage, or to obtain refills, the patient should contact the pharmacy via MyChart or by calling [phone]. The patient verbalized acceptance and understanding.    Follow-up Plan  The patient will be reassessed within 1 year of starting the medication.    Discussed option to fill at a [facility] pharmacy. The medication(s) will be received from an outside pharmacy Production designer, theatre/television/film) based on [employer].     Duwaine Brackett, [facility]  Labs and Diagnostic Tests  Lab Results   Component Value Date/Time    WBC 5.4 03/05/2022 04:28 PM  RBC 4.62 03/05/2022 04:28 PM    HGB 13.9 03/05/2022 04:28 PM    HCT 40.9 03/05/2022 04:28 PM    MCV 88.5 03/05/2022 04:28 PM    MCH 30.1 03/05/2022 04:28 PM    MCHC 34.0 03/05/2022 04:28 PM    RDW 12.8 03/05/2022 04:28 PM    PLTCT 312 03/05/2022 04:28 PM    MPV 7.7 03/05/2022 04:28 PM       Lab Results   Component Value Date/Time    NEUT 58 03/05/2022 04:28 PM    ANC 3.11 03/05/2022 04:28 PM    LYMA 30 03/05/2022 04:28 PM    ALC 1.60 03/05/2022 04:28 PM    MONA 8 03/05/2022 04:28 PM    AMC 0.41 03/05/2022 04:28 PM    EOSA 3 03/05/2022 04:28 PM    AEC 0.17 03/05/2022 04:28 PM    BASA 1 03/05/2022 04:28 PM    ABC 0.07 03/05/2022 04:28 PM       Lab Results   Component Value Date/Time    NA 140 03/04/2023 02:30 PM    K 4.0 03/04/2023 02:30 PM    CL 101 03/04/2023 02:30 PM    CO2 35 (H) 03/04/2023 02:30 PM    GAP 8 03/05/2022 04:28 PM    BUN 11 03/04/2023 02:30 PM    CR 0.78 03/04/2023 02:30 PM    GLU 63 (L) 03/04/2023 02:30 PM       Lab Results   Component Value Date/Time    CA 9.6 03/04/2023 02:30 PM    ALBUMIN 4.5 03/04/2023 02:30 PM    TOTPROT 6.8 03/04/2023 02:30 PM    ALKPHOS 119 03/04/2023 02:30 PM    AST 14 03/04/2023 02:30 PM    ALT 13 03/04/2023 02:30 PM    TOTBILI 0.6 03/04/2023 02:30 PM    GFR 90 03/04/2023 02:30 PM    GFRAA >60 09/04/2019 02:41 PM

## 2023-10-15 ENCOUNTER — Encounter: Admit: 2023-10-15 | Discharge: 2023-10-15 | Payer: PRIVATE HEALTH INSURANCE

## 2023-10-15 IMAGING — MR MR cervical spine wo/w con
4 of 9 series · 12 of 48 positions shown · IV contrast (gadolinium)
Comparison: CT cervical spine dated 09/22/2021.

SPCERVWW
REASON FOR EXAM: 56 years old Female with history of radiculopathy. History of a prior vertebral
fusion.  History of multiple sclerosis.
TECHNIQUE: Noncontrast T1, T2 and STIR sagittal sequences and T2-weighted axial sequences are
obtained through the cervical spine. Additional contrast enhanced T1 axial and sagittal sequences of
the cervical spine were obtained. 10 mL of IV gadolinium was administered.

[Series 301: T1 · sagittal · 3.0mm · 0.40mm/px · 3 of 15 slices shown]
[im 1/15]
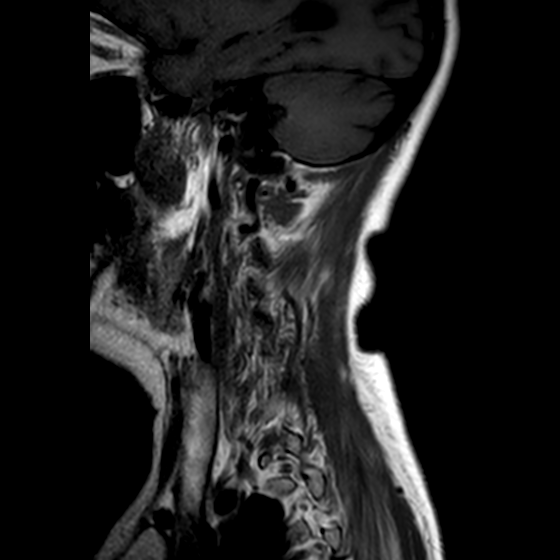
[im 8/15]
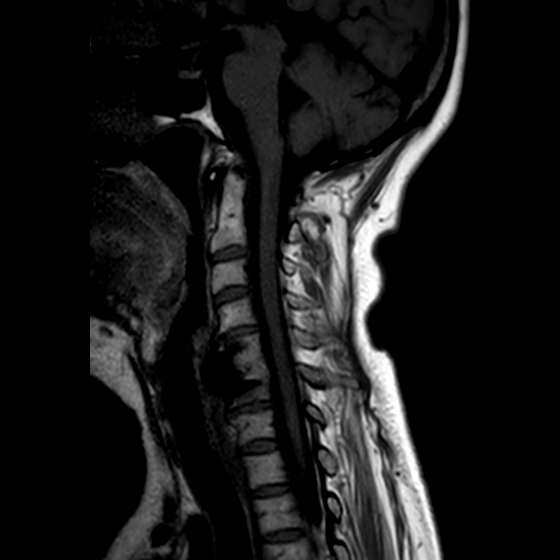
[im 15/15]
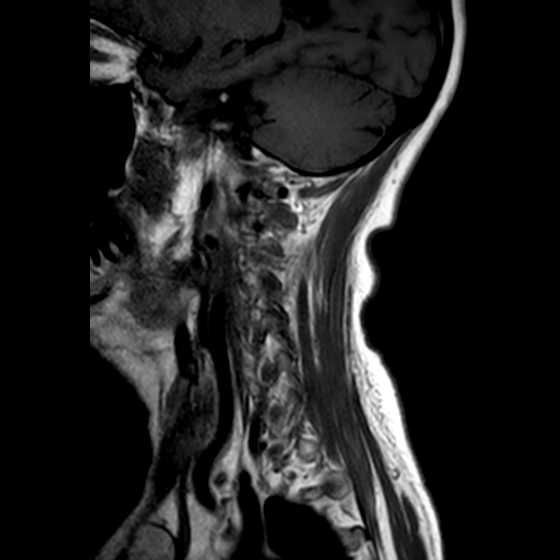

[Series 401: T2 · sagittal · 3.0mm · 0.26mm/px · 3 of 15 slices shown (1 of 2)]
[im 1/15]
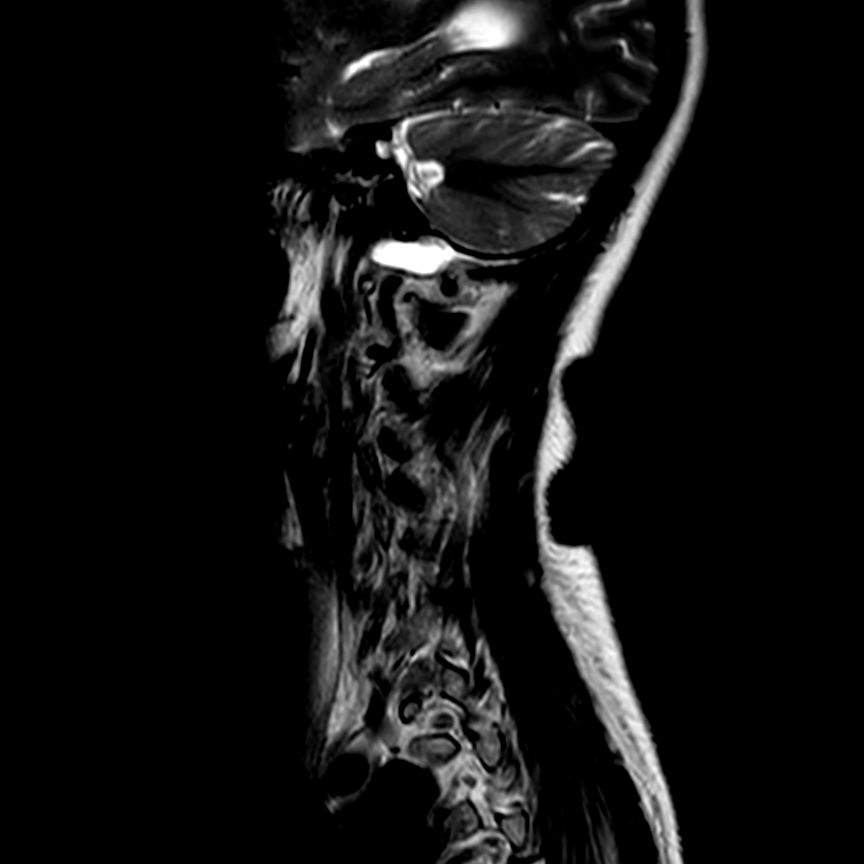
[im 10/15]
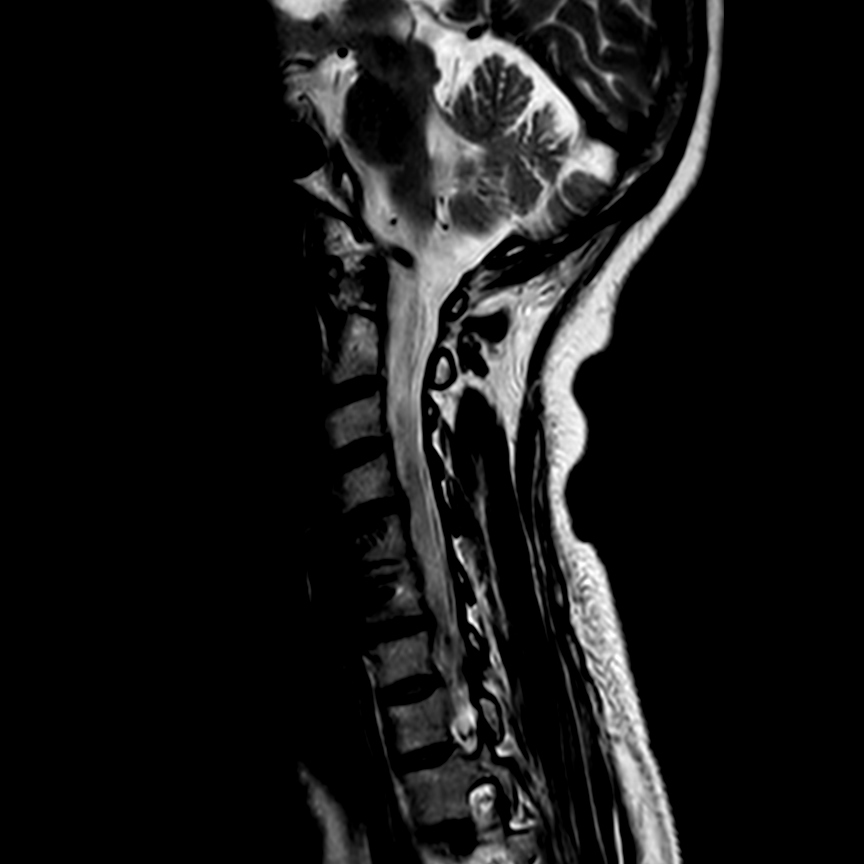
[im 15/15]
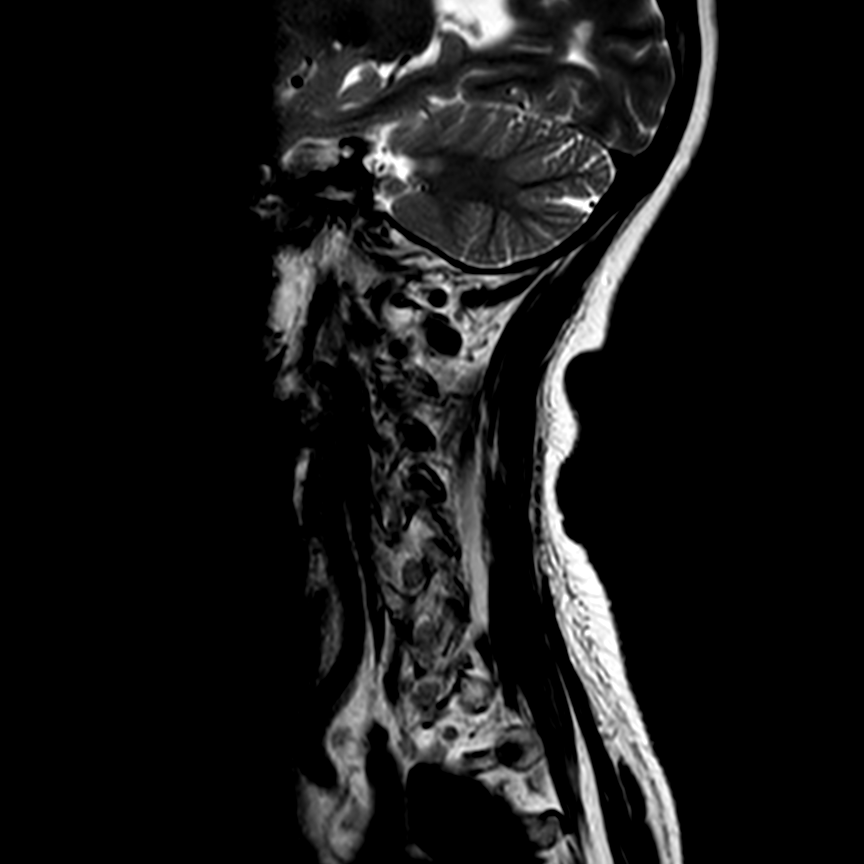

[Series 501: STIR · sagittal · 3.0mm · 0.38mm/px · 3 of 15 slices shown]
[im 1/15]
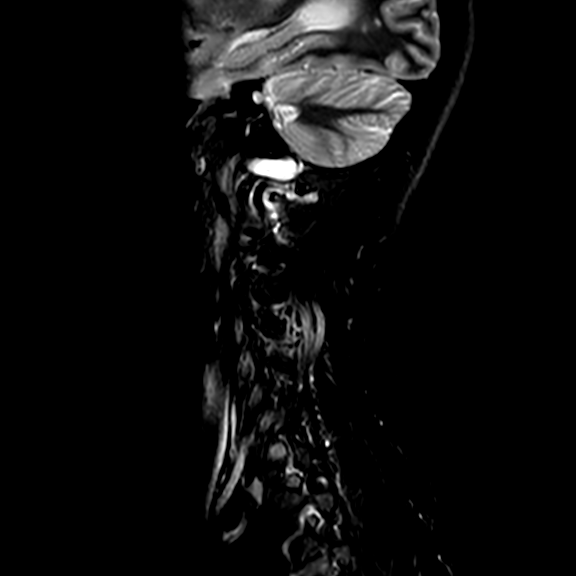
[im 10/15]
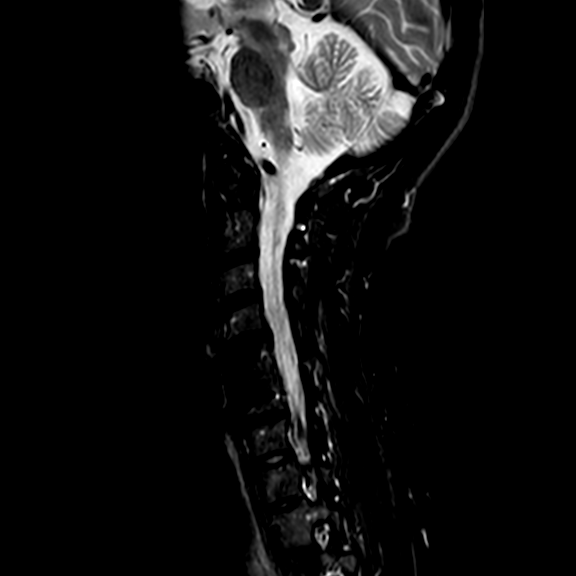
[im 15/15]
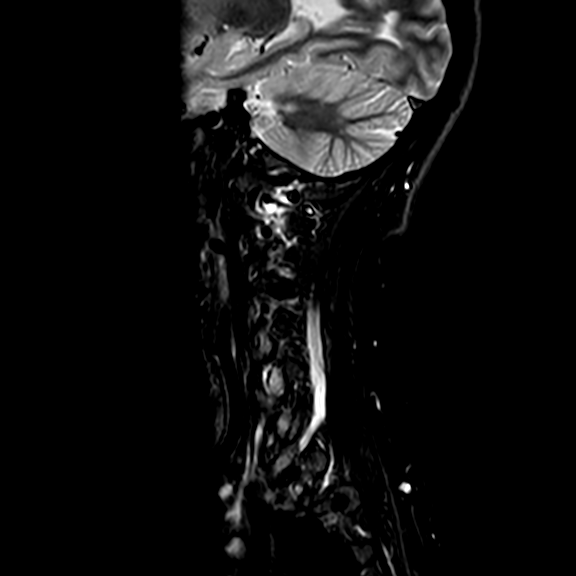

[Series 701: T2 · axial · 3.5mm · 0.39mm/px · z∈[-18,+66]mm · 3 of 30 slices shown (2 of 2)]
[im 5/30]
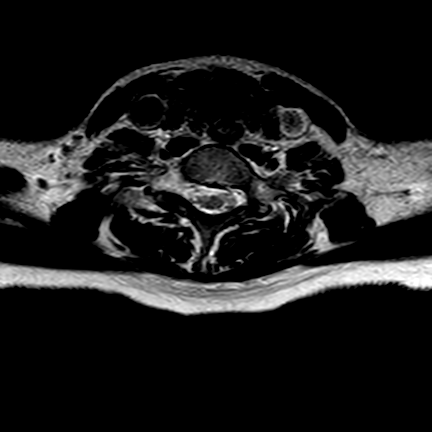
[im 17/30]
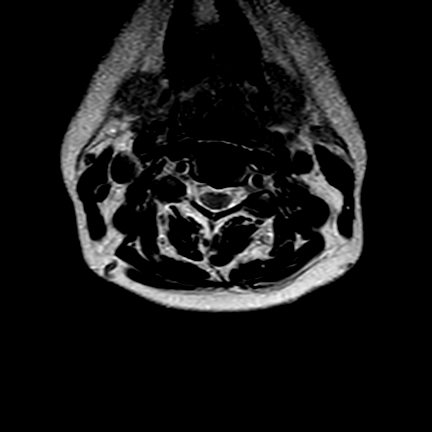
[im 25/30]
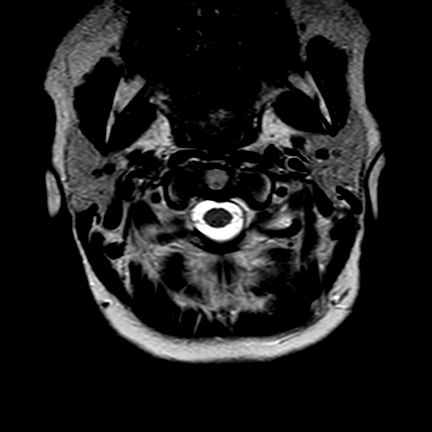

[12 of 48 positions shown; findings below may reference images not displayed]

FINDINGS: Straightening of the cervical spine with loss of normal lordotic curvature.
Postsurgical changes of anterior vertebral fusion at C5-C6 with susceptibility artifact related to
hardware. Intervertebral disk spacer at C5-C6. No evidence of osteomyelitis or diskitis. No focal
fluid collection or abscess at the surgical bed.
The vertebral body heights are maintained. No acute vertebral body compression fracture. No focal
enhancing osseous lesion.
Multilevel degenerative disk desiccation and disk height loss, most prominent and C4-C5 and C6-C7.
The visualized spinal cord shows multiple scattered T2/STIR  hyperintense foci in cervical and
visualized upper thoracic spinal cord. These lesions show no enhancement on contrast administration.
No focal cord expansion. The reference lesion at C2 vertebral body measures approximately 5 mm
(image 9 series 501).
No focal cord compression. No enhancing cord lesion.
No epidural fluid collection or hematoma.
The visualized craniocervical junction is unremarkable.
The prevertebral and posterior paraspinal soft tissues show no large focal fluid collection or
enhancing mass.
The level-wise axial images demonstrate as follows:
C2-C3: No significant disk bulge, neural foraminal or spinal canal stenosis.
C3-C4: No significant disk bulge, neural foraminal or spinal canal stenosis.
C4-C5: Mild diffuse disk bulge. Bilateral minimal uncovertebral osteophytes. No significant neural
foraminal stenosis. Mild spinal canal narrowing is seen, measuring 9 mm in AP dimension
C5-C6: Postsurgical changes are seen. Moderate right neural foraminal narrowing. No significant left
foraminal stenosis. No significant spinal canal stenosis.
C6-C7: Diffuse disk bulge. Small right uncovertebral osteophyte. There is moderate right neural
foraminal stenosis. No significant left foraminal stenosis. Mild spinal canal narrowing is seen,
measuring 8 mm in AP dimension
C7-T1: No significant disk bulge, neural foraminal or spinal canal stenosis.
IMPRESSION: 1. No acute compression fractures in the cervical spine. No osseous lesions.
2. Postsurgical changes of anterior vertebral fusion at C5-C6. No evidence of osteomyelitis or
diskitis. No focal fluid collection or abscess.
3. Multilevel degenerative changes in cervical spine, most prominent at C4-C5 and C6-C7.
4. . Multiple T2/STIR hyperintense foci in cervical and visualized upper thoracic spinal cord. These
are consistent with demyelinating plaques given patient's history of multiple sclerosis. No
enhancing lesion is seen to suggest active demyelination.

## 2023-10-16 ENCOUNTER — Encounter: Admit: 2023-10-16 | Discharge: 2023-10-16 | Payer: PRIVATE HEALTH INSURANCE

## 2023-10-17 ENCOUNTER — Encounter: Admit: 2023-10-17 | Discharge: 2023-10-17 | Payer: PRIVATE HEALTH INSURANCE

## 2023-10-17 MED ORDER — MODAFINIL 200 MG PO TAB
200 mg | ORAL_TABLET | Freq: Every day | ORAL | 5 refills | 30.00000 days | Status: AC
Start: 2023-10-17 — End: ?

## 2023-11-09 ENCOUNTER — Encounter: Admit: 2023-11-09 | Discharge: 2023-11-09 | Payer: PRIVATE HEALTH INSURANCE

## 2023-11-15 ENCOUNTER — Encounter: Admit: 2023-11-15 | Discharge: 2023-11-15 | Payer: PRIVATE HEALTH INSURANCE

## 2023-11-15 NOTE — Progress Notes
 PA for Tramadol  was submitted through covermymeds KEY: [id number]

## 2023-11-20 ENCOUNTER — Encounter: Admit: 2023-11-20 | Discharge: 2023-11-20 | Payer: PRIVATE HEALTH INSURANCE

## 2023-12-10 ENCOUNTER — Encounter: Admit: 2023-12-10 | Discharge: 2023-12-10 | Payer: PRIVATE HEALTH INSURANCE

## 2023-12-27 ENCOUNTER — Encounter: Admit: 2023-12-27 | Discharge: 2023-12-27 | Payer: PRIVATE HEALTH INSURANCE

## 2023-12-27 DIAGNOSIS — R5382 Chronic fatigue, unspecified: Secondary | ICD-10-CM

## 2023-12-27 DIAGNOSIS — G35D Multiple sclerosis: Secondary | ICD-10-CM

## 2023-12-27 DIAGNOSIS — E538 Deficiency of other specified B group vitamins: Principal | ICD-10-CM

## 2023-12-27 NOTE — Progress Notes
 Patient went to have standing labs - immunoglobulins and lymph subset done at [facility] in [place]- she is also wanting B12 done per 10-15-23 mychart message.   Order placed and faxed to [phone]

## 2023-12-31 ENCOUNTER — Encounter: Admit: 2023-12-31 | Discharge: 2023-12-31 | Payer: PRIVATE HEALTH INSURANCE

## 2024-01-15 ENCOUNTER — Encounter: Admit: 2024-01-15 | Discharge: 2024-01-15 | Payer: PRIVATE HEALTH INSURANCE

## 2024-01-15 MED ORDER — TRAMADOL 50 MG PO TAB
1 | ORAL_TABLET | ORAL | 2 refills | 7.00000 days | Status: AC | PRN
Start: 2024-01-15 — End: ?

## 2024-01-16 ENCOUNTER — Encounter: Admit: 2024-01-16 | Discharge: 2024-01-16 | Payer: PRIVATE HEALTH INSURANCE

## 2024-01-23 ENCOUNTER — Encounter: Admit: 2024-01-23 | Discharge: 2024-01-23 | Payer: PRIVATE HEALTH INSURANCE

## 2024-01-23 MED ORDER — DALFAMPRIDINE 10 MG PO TB12
10 mg | ORAL_TABLET | Freq: Two times a day (BID) | ORAL | 5 refills | 30.00000 days | Status: AC
Start: 2024-01-23 — End: ?

## 2024-01-23 NOTE — Telephone Encounter [36]
 Dalfampridine  needs PA- KEY: [id number], rx sent to [facility] for PA to be completed

## 2024-01-24 ENCOUNTER — Encounter: Admit: 2024-01-24 | Discharge: 2024-01-24 | Payer: PRIVATE HEALTH INSURANCE

## 2024-01-24 NOTE — Progress Notes [1]
 Pharmacy Benefits Investigation    Medication name: dalfampridine  (AMPYRA ) 10 mg tablet  Medication status: continuation (refill)    The insurance requires a prior authorization for the medication. The prior authorization has not been submitted because waiting on clinical questions-[id number] is needed. PA materials are needed.    Monica Burnett  Specialty Pharmacy Patient Advocate

## 2024-01-27 ENCOUNTER — Encounter: Admit: 2024-01-27 | Discharge: 2024-01-27 | Payer: PRIVATE HEALTH INSURANCE

## 2024-01-27 NOTE — Progress Notes [1]
 Pharmacy Benefits Investigation    Medication name: dalfampridine  (AMPYRA ) 10 mg tablet  Medication status: continuation (refill)    The insurance requires a prior authorization for the medication. The prior authorization renewal was submitted via CoverMyMeds.    PA number: [id number]      Harlene Hummer  Specialty Pharmacy Patient Advocate

## 2024-01-29 ENCOUNTER — Encounter: Admit: 2024-01-29 | Discharge: 2024-01-29 | Payer: PRIVATE HEALTH INSURANCE

## 2024-01-30 ENCOUNTER — Encounter: Admit: 2024-01-30 | Discharge: 2024-01-30 | Payer: PRIVATE HEALTH INSURANCE

## 2024-01-30 NOTE — Progress Notes [1]
 Pharmacy Benefits Investigation    Medication name: dalfampridine  (AMPYRA ) 10 mg tablet  Medication status: continuation (refill)    The prior authorization was denied by insurance.    Insurance provided the following reasons for the denial: See below.    If an appeal is pursued, it should be submitted by fax to [phone].    If an alternative therapy is not appropriate and the prescribed medication and dose are necessary, the insurance will require the decision be appealed. This will require a letter for medical necessity. The clinic pharmacist was notified. To proceed with an appeal, completed appeal materials should be returned to the specialty pharmacy team.      Monica Burnett  Specialty Pharmacy Patient Advocate

## 2024-01-31 ENCOUNTER — Encounter: Admit: 2024-01-31 | Discharge: 2024-01-31 | Payer: PRIVATE HEALTH INSURANCE

## 2024-01-31 MED ORDER — DALFAMPRIDINE 10 MG PO TB12
10 mg | ORAL_TABLET | Freq: Two times a day (BID) | ORAL | 5 refills | 30.00000 days | Status: AC
Start: 2024-01-31 — End: ?

## 2024-01-31 NOTE — Progress Notes [1]
 Specialty Medication Insurance Denial Follow-up      Medication name: DALFAMPRIDINE  10 MG PO TB12  Reason: follow-up    The medication was denied by insurance due to Patient to demonstrate a 15% improvement in walking ability. The plan is to obtain additional information. Will send dalfampridine  to [facility] instead.. Notified patient.    Spoke with patient. The patient's questions and concerns were addressed as needed. The patient was instructed to contact their healthcare provider if their symptoms or health problems require medical attention. For clinical questions about the medication, the pharmacist can be reached at [phone].      Duwaine Brackett, PHARMD

## 2024-02-05 ENCOUNTER — Encounter: Admit: 2024-02-05 | Discharge: 2024-02-05 | Payer: PRIVATE HEALTH INSURANCE

## 2024-02-21 ENCOUNTER — Encounter: Admit: 2024-02-21 | Discharge: 2024-02-21

## 2024-02-21 DIAGNOSIS — G43109 Migraine with aura, not intractable, without status migrainosus: Principal | ICD-10-CM

## 2024-02-21 MED ORDER — UBRELVY 50 MG PO TAB
ORAL_TABLET | ORAL | 5 refills | 30.00000 days | Status: AC
Start: 2024-02-21 — End: ?

## 2024-02-21 NOTE — Telephone Encounter [36]
 Refill request for Ubrelvy , last office visit 08-28-23 - rx included in plan of care, next office visit scheduled 03-02-24, Pended to Dr. Leodis

## 2024-02-22 ENCOUNTER — Encounter: Admit: 2024-02-22 | Discharge: 2024-02-22

## 2024-02-24 ENCOUNTER — Encounter: Admit: 2024-02-24 | Discharge: 2024-02-24 | Payer: PRIVATE HEALTH INSURANCE

## 2024-02-25 ENCOUNTER — Encounter: Admit: 2024-02-25 | Discharge: 2024-02-25 | Payer: PRIVATE HEALTH INSURANCE

## 2024-02-27 ENCOUNTER — Ambulatory Visit: Admit: 2024-02-27 | Discharge: 2024-02-27 | Payer: PRIVATE HEALTH INSURANCE

## 2024-02-27 ENCOUNTER — Encounter: Admit: 2024-02-27 | Discharge: 2024-02-27 | Payer: PRIVATE HEALTH INSURANCE

## 2024-02-28 ENCOUNTER — Encounter: Admit: 2024-02-28 | Discharge: 2024-02-28 | Payer: PRIVATE HEALTH INSURANCE

## 2024-03-02 ENCOUNTER — Ambulatory Visit: Admit: 2024-03-02 | Discharge: 2024-03-03 | Payer: PRIVATE HEALTH INSURANCE

## 2024-03-02 ENCOUNTER — Encounter: Admit: 2024-03-02 | Discharge: 2024-03-02 | Payer: PRIVATE HEALTH INSURANCE

## 2024-03-03 ENCOUNTER — Encounter: Admit: 2024-03-03 | Discharge: 2024-03-03 | Payer: PRIVATE HEALTH INSURANCE

## 2024-03-03 DIAGNOSIS — G35D Multiple sclerosis: Principal | ICD-10-CM

## 2024-03-03 NOTE — Progress Notes [1]
 FAXED infusion orders to specialty by [facility] at [phone]

## 2024-03-11 ENCOUNTER — Encounter: Admit: 2024-03-11 | Discharge: 2024-03-11 | Payer: PRIVATE HEALTH INSURANCE

## 2024-04-01 ENCOUNTER — Encounter: Admit: 2024-04-01 | Discharge: 2024-04-01 | Payer: PRIVATE HEALTH INSURANCE

## 2024-04-01 DIAGNOSIS — G35D Multiple sclerosis: Principal | ICD-10-CM

## 2024-04-01 MED ORDER — OCREVUS 30 MG/ML IV SOLN
600 mg | Freq: Once | INTRAVENOUS | 0 refills | Status: AC
Start: 2024-04-01 — End: ?

## 2024-04-01 NOTE — Telephone Encounter [36]
 Orders for Ocrevus  were faxed in December and then re-faxed today, they are also needing a prescription as well. Pended to Dr. Leodis to approve
